# Patient Record
Sex: Female | Born: 1975 | Race: White | Hispanic: No | Marital: Married | Smoking: Former smoker
Health system: Southern US, Community
[De-identification: ages and names within clinical notes are randomized; demographics above are authoritative.]

## PROBLEM LIST (undated history)

## (undated) ENCOUNTER — Inpatient Hospital Stay (HOSPITAL_COMMUNITY): Payer: Self-pay

## (undated) DIAGNOSIS — A048 Other specified bacterial intestinal infections: Secondary | ICD-10-CM

## (undated) DIAGNOSIS — K5909 Other constipation: Secondary | ICD-10-CM

## (undated) DIAGNOSIS — D649 Anemia, unspecified: Secondary | ICD-10-CM

## (undated) DIAGNOSIS — J45909 Unspecified asthma, uncomplicated: Secondary | ICD-10-CM

## (undated) DIAGNOSIS — Z8619 Personal history of other infectious and parasitic diseases: Secondary | ICD-10-CM

## (undated) DIAGNOSIS — J189 Pneumonia, unspecified organism: Secondary | ICD-10-CM

## (undated) DIAGNOSIS — K589 Irritable bowel syndrome without diarrhea: Secondary | ICD-10-CM

## (undated) DIAGNOSIS — K649 Unspecified hemorrhoids: Secondary | ICD-10-CM

## (undated) DIAGNOSIS — O09529 Supervision of elderly multigravida, unspecified trimester: Secondary | ICD-10-CM

## (undated) HISTORY — DX: Personal history of other infectious and parasitic diseases: Z86.19

## (undated) HISTORY — PX: INTRAUTERINE DEVICE (IUD) INSERTION: SHX5877

## (undated) HISTORY — DX: Pneumonia, unspecified organism: J18.9

## (undated) HISTORY — DX: Supervision of elderly multigravida, unspecified trimester: O09.529

## (undated) HISTORY — DX: Other constipation: K59.09

## (undated) HISTORY — DX: Unspecified hemorrhoids: K64.9

## (undated) HISTORY — DX: Anemia, unspecified: D64.9

## (undated) HISTORY — DX: Other specified bacterial intestinal infections: A04.8

## (undated) HISTORY — DX: Unspecified asthma, uncomplicated: J45.909

## (undated) HISTORY — PX: WISDOM TOOTH EXTRACTION: SHX21

## (undated) HISTORY — PX: BREAST BIOPSY: SHX20

## (undated) HISTORY — DX: Irritable bowel syndrome, unspecified: K58.9

---

## 2011-12-16 ENCOUNTER — Other Ambulatory Visit: Payer: Self-pay | Admitting: Obstetrics and Gynecology

## 2011-12-16 DIAGNOSIS — R928 Other abnormal and inconclusive findings on diagnostic imaging of breast: Secondary | ICD-10-CM

## 2011-12-18 ENCOUNTER — Ambulatory Visit
Admission: RE | Admit: 2011-12-18 | Discharge: 2011-12-18 | Disposition: A | Discharge status: Home / Self Care | Payer: BC Managed Care – PPO | Source: Ambulatory Visit | Attending: Obstetrics and Gynecology | Admitting: Obstetrics and Gynecology

## 2011-12-18 DIAGNOSIS — R928 Other abnormal and inconclusive findings on diagnostic imaging of breast: Secondary | ICD-10-CM

## 2011-12-26 ENCOUNTER — Ambulatory Visit
Admission: RE | Admit: 2011-12-26 | Discharge: 2011-12-26 | Disposition: A | Discharge status: Home / Self Care | Payer: BC Managed Care – PPO | Source: Ambulatory Visit | Attending: Obstetrics and Gynecology | Admitting: Obstetrics and Gynecology

## 2011-12-26 ENCOUNTER — Ambulatory Visit: Admission: RE | Admit: 2011-12-26 | Payer: BC Managed Care – PPO | Source: Ambulatory Visit

## 2011-12-26 ENCOUNTER — Other Ambulatory Visit: Payer: Self-pay | Admitting: Obstetrics and Gynecology

## 2011-12-26 DIAGNOSIS — R928 Other abnormal and inconclusive findings on diagnostic imaging of breast: Secondary | ICD-10-CM

## 2012-02-27 LAB — OB RESULTS CONSOLE RUBELLA ANTIBODY, IGM: Rubella: IMMUNE

## 2012-02-27 LAB — OB RESULTS CONSOLE GC/CHLAMYDIA
Chlamydia: NEGATIVE
Gonorrhea: NEGATIVE

## 2012-02-27 LAB — OB RESULTS CONSOLE ANTIBODY SCREEN: Antibody Screen: NEGATIVE

## 2012-06-08 ENCOUNTER — Inpatient Hospital Stay (HOSPITAL_COMMUNITY)
Admission: AD | Admit: 2012-06-08 | Discharge: 2012-06-09 | Disposition: A | Discharge status: Home / Self Care | Payer: BC Managed Care – PPO | Source: Ambulatory Visit | Attending: Obstetrics and Gynecology | Admitting: Obstetrics and Gynecology

## 2012-06-08 ENCOUNTER — Encounter (HOSPITAL_COMMUNITY): Payer: Self-pay | Admitting: *Deleted

## 2012-06-08 DIAGNOSIS — K645 Perianal venous thrombosis: Secondary | ICD-10-CM

## 2012-06-08 MED ORDER — LIDOCAINE HCL (PF) 1 % IJ SOLN: 10.0000 mL | Freq: Once | INTRAMUSCULAR | Status: DC

## 2012-06-08 MED ORDER — OXYCODONE-ACETAMINOPHEN 5-325 MG PO TABS: 1.0000 | ORAL_TABLET | ORAL | Status: DC | PRN

## 2012-06-08 MED ORDER — OXYCODONE-ACETAMINOPHEN 5-325 MG PO TABS
2.0000 | ORAL_TABLET | Freq: Once | ORAL | Status: AC
  Administered 2012-06-08: 2 via ORAL
  Filled 2012-06-08: qty 2

## 2012-06-08 MED ORDER — LIDOCAINE HCL (PF) 1 % IJ SOLN
INTRAMUSCULAR | Status: AC
  Filled 2012-06-08: qty 30

## 2012-06-08 NOTE — Progress Notes (Signed)
24 4/7 weeks S/P I&D of hemorrhoid today in office Pain now worse BM x 1 after office today, hx of constipation  VSS Afeb FHT + Two hemorrhoids noted at about 12:00 and 5:00 Bottom one S/P I&D has re accumulated clot, top one possibly with clot D/W patient and husband Procedure: betadine prep, injected with 1 % plain lidocaine                     I&D of bottom hemorrhoid-clot expelled                     I&D of top one-no clot obtained                     Patient tolerated well  A: thrombosed hemorrhoid-S/P I&D P: D/W patient water intake, Mirilax stool softener, fruit, etc     Peri bottle given, rest on side prn

## 2012-06-08 NOTE — MAU Provider Note (Signed)
Chief Complaint:  Hemorrhoids   First Provider Initiated Contact with Patient 06/08/12 2300    HPI: Alyssa Martinez is a 37 y.o. G2P1001 at [redacted]w[redacted]d who presents to maternity admissions reporting increased pain since lancing of hemorrhoid at office today. Denies fever, chills. Scant bleeding from procedure. No purulent drainage. Had BM this afternoon. No improvement in pain w/ Tylenol. Denies contractions, leakage of fluid or vaginal bleeding. Good fetal movement.   Past Medical History: No past medical history on file.  Past obstetric history: OB History   Grav Para Term Preterm Abortions TAB SAB Ect Mult Living   2 1 1       1      # Outc Date GA Lbr Len/2nd Wgt Sex Del Anes PTL Lv   1 TRM 2011    M SVD EPI  Yes   Comments: fever in labor, Newborn Abx   2 CUR               Past Surgical History: No past surgical history on file.  Family History: No family history on file.  Social History: History  Substance Use Topics  . Smoking status: Never Smoker   . Smokeless tobacco: Not on file  . Alcohol Use: No    Allergies: No Known Allergies  Meds:  Prescriptions prior to admission  Medication Sig Dispense Refill  . acetaminophen (TYLENOL) 500 MG tablet Take 500 mg by mouth every 6 (six) hours as needed for pain.      Marland Kitchen albuterol (PROVENTIL HFA;VENTOLIN HFA) 108 (90 BASE) MCG/ACT inhaler Inhale 2 puffs into the lungs every 6 (six) hours as needed for wheezing.      . docusate sodium (COLACE) 50 MG capsule Take by mouth 2 (two) times daily.      Marland Kitchen loratadine (CLARITIN) 10 MG tablet Take 10 mg by mouth daily.      . Prenatal Vit-Fe Fumarate-FA (PRENATAL MULTIVITAMIN) TABS Take 1 tablet by mouth daily at 12 noon.        ROS: Pertinent findings in history of present illness.  Physical Exam  Blood pressure 117/67, pulse 72, temperature 98 F (36.7 C), temperature source Oral, resp. rate 18, height 5\' 8"  (1.727 m), weight 162 lb (73.483 kg), last menstrual period  12/19/2011. GENERAL: Well-developed, well-nourished female in moderate distress. Unable to sit. HEENT: normocephalic HEART: normal rate RESP: normal effort ABDOMEN: Soft, non-tender, gravid appropriate for gestational age EXTREMITIES: Nontender, no edema NEURO: alert and oriented SPECULUM EXAM: Deferred RECTAL: 2 hemorrhoids.  #1: posterior, 2x3 cm, thrombosed, very tender, firm, closed incision. No bleeding or drainage.   #2: anterior 1x2 cm, pink, moderately tender, firm.     FHR 156 by doppler   Labs: NA  Procedure:  See Dr. Huel Coventry note. Verbal consent obtained.   MAU Course: Pt's pain significantly improved w/ removal of thrombus and Percocet.   Assessment: 1. Thrombosed external hemorrhoid    Plan: Discharge home Perterm labor precautions and fetal kick counts. Ice to hemorrhoids x 24 hours.  Increase fluids. Continue stool softener.  Follow-up Information   Follow up with [facility],JAMES E, MD. (as scheduled or as needed if symptoms worsen)    Contact information:   [address] [place] [address] [phone]       Follow up with [facility] [place] [facility] ADMISS[facility]. (As needed if symptoms worsen)    Contact information:   [address] [place] [address] [phone]  Medication List    TAKE these medications       acetaminophen 500 MG tablet  Commonly known as:  TYLENOL  Take 500 mg by mouth every 6 (six) hours as needed for pain.     albuterol 108 (90 BASE) MCG/ACT inhaler  Commonly known as:  PROVENTIL HFA;VENTOLIN HFA  Inhale 2 puffs into the lungs every 6 (six) hours as needed for wheezing.     docusate sodium 50 MG capsule  Commonly known as:  COLACE  Take by mouth 2 (two) times daily.     loratadine 10 MG tablet  Commonly known as:  CLARITIN  Take 10 mg by mouth daily.     oxyCODONE-acetaminophen 5-325 MG per tablet  Commonly known as:  ROXICET  Take 1-2 tablets by mouth  every 4 (four) hours as needed for pain.     prenatal multivitamin Tabs  Take 1 tablet by mouth daily at 12 noon.        [place], [place] 06/08/2012 10:13 PM

## 2012-06-08 NOTE — MAU Note (Addendum)
Pt reports that she was seen in office today. Dr. In office lanced her hemorroid. Pt now reports increase in pain. Pt took tylenol last @ 1800.

## 2012-06-09 ENCOUNTER — Encounter (INDEPENDENT_AMBULATORY_CARE_PROVIDER_SITE_OTHER): Payer: Self-pay | Admitting: General Surgery

## 2012-06-09 ENCOUNTER — Ambulatory Visit (INDEPENDENT_AMBULATORY_CARE_PROVIDER_SITE_OTHER): Payer: BC Managed Care – PPO | Admitting: General Surgery

## 2012-06-09 VITALS — BP 125/77 | HR 62 | Temp 98.4°F | Resp 16 | Ht 68.0 in | Wt 160.0 lb

## 2012-06-09 DIAGNOSIS — K645 Perianal venous thrombosis: Secondary | ICD-10-CM

## 2012-06-09 NOTE — Progress Notes (Signed)
Subjective:     Patient ID: Alyssa Martinez, female   DOB: 10-20-1975, 37 y.o.   MRN: [id number]  HPI 37 year old Caucasian female who is 24-4/[redacted] weeks pregnant is referred by Dr. Henderson Cloud for evaluation of recurrent thrombosed external hemorrhoids. The patient states she developed 3 hemorrhoids during the delivery of her first pregnancy however they subsequently resolved. During her second pregnancy she has had some intermittent problems with hemorrhoids. However they have been particular bothersome for the past month. She has tried numerous over-the-counter products. She has tried Preparation H and tux pads as well as Anusol suppository. They became acutely painful yesterday. She underwent incision and evacuation of a thrombosed external hemorrhoid and her OB/GYN's office yesterday morning. She simply got worse throughout the day with more pain and more swelling and she presented to the hospital last night where they were relanced. She states that the pain is worsening around her bottom. She has had some problems with constipation. She has a bowel movement every couple days. She has been taking Colace. She just started MiraLAX last night. She doesn't drink a lot of water. She does not sit on the commode for prolonged period of time. She complains of some intermittent itching around her bottom. She denies any incontinence.she denies any dysuria  PMHx, PSHx, SOCHx, FAMHx, ALL reviewed    Review of Systems 8 point review of systems is performed all systems are negative except for what is mentioned in history of present illness.    Objective:   Physical Exam  Constitutional: She is oriented to person, place, and time. She appears well-developed and well-nourished.  Looks uncomfortable  HENT:  Head: Normocephalic and atraumatic.  Right Ear: External ear normal.  Left Ear: External ear normal.  Eyes: Conjunctivae are normal.  Pulmonary/Chest: Effort normal. No stridor. No respiratory distress.    Abdominal: Soft.  Genitourinary:     Neurological: She is alert and oriented to person, place, and time.  Skin: Skin is warm and dry. She is not diaphoretic.  Psychiatric: She has a normal mood and affect. Her behavior is normal. Judgment and thought content normal.       Assessment:     Thrombosed left sided external hemorrhoids     Plan:     We discussed hemorrhoidal disease. The patient is given Agricultural engineer. Since she is still symptomatic and they're still quite swollen I recommended repeat incision and evacuation of the clot. They're too large to be completely excised today. We discussed the importance of drinking 6-8 glasses of water a day. We discussed using sitz baths. We also discussed taking stool softeners and MiraLAX. We discussed the importance of having a daily soft bulky bowel movement. We discussed the importance of incorporating fiber in her diet. She was given Agricultural engineer.  After obtaining verbal consent the area was prepped with Betadine. A total of 3 cc of 2% Xylocaine with epinephrine was infiltrated throughout the entire hemorrhoid. I then made a one-inch incision in the left posterior lateral hemorrhoid and evacuated the entire clot. I then made a separate incision in the left anterior hemorrhoid which was smaller and evacuated clot. There is minimal bleeding.  She was given wound care instructions. I spent approximately 30 minutes with this patient  Followup as needed.   Mary Sella. Andrey Campanile, MD, FACS General, Bariatric, & Minimally Invasive Surgery [facility], [place]

## 2012-06-09 NOTE — Patient Instructions (Signed)
Hemorrhoids Hemorrhoids are enlarged (dilated) veins around the rectum. There are 2 types of hemorrhoids, and the type of hemorrhoid is determined by its location. Internal hemorrhoids occur in the veins just inside the rectum.They are usually not painful, but they may bleed.However, they may poke through to the outside and become irritated and painful. External hemorrhoids involve the veins outside the anus and can be felt as a painful swelling or hard lump near the anus.They are often itchy and may crack and bleed. Sometimes clots will form in the veins. This makes them swollen and painful. These are called thrombosed hemorrhoids. CAUSES Causes of hemorrhoids include:  Pregnancy. This increases the pressure in the hemorrhoidal veins.  Constipation.  Straining to have a bowel movement.  Obesity.  Heavy lifting or other activity that caused you to strain.   HOME CARE INSTRUCTIONS   Increase fiber in your diet. Ask your caregiver about using fiber supplements.  Drink enough water and fluids to keep your urine clear or pale yellow.  Exercise regularly.  Go to the bathroom when you have the urge to have a bowel movement. Do not wait.  Avoid straining to have bowel movements.  Keep the anal area dry and clean.  Only take over-the-counter or prescription medicines for pain, discomfort, or fever as directed by your caregiver. If your hemorrhoids are thrombosed:  Take warm sitz baths for 20 to 30 minutes, 3 to 4 times per day.  If the hemorrhoids are very tender and swollen, place ice packs on the area as tolerated. Using ice packs between sitz baths may be helpful. Fill a plastic bag with ice. Place a towel between the bag of ice and your skin.  Medicated creams and suppositories may be used or applied as directed.  Do not use a donut-shaped pillow or sit on the toilet for long periods. This increases blood pooling and pain. SEEK MEDICAL CARE IF:   You have increasing pain and  swelling that is not controlled with your medicine.  You have uncontrolled bleeding.  You have difficulty or you are unable to have a bowel movement.  You have pain or inflammation outside the area of the hemorrhoids.  You have chills or an oral temperature above 102 F (38.9 C). MAKE SURE YOU:   Understand these instructions.  Will watch your condition.  Will get help right away if you are not doing well or get worse. Document Released: 02/02/2000 Document Revised: 04/29/2011 Document Reviewed: 01/15/2010 [facility] Patient Information 2013 [place], [place].   GETTING TO GOOD BOWEL HEALTH. Irregular bowel habits such as constipation and diarrhea can lead to many problems over time.  Having one soft bowel movement a day is the most important way to prevent further problems.  The anorectal canal is designed to handle stretching and feces to safely manage our ability to get rid of solid waste (feces, poop, stool) out of our body.  BUT, hard constipated stools can act like ripping concrete bricks and diarrhea can be a burning fire to this very sensitive area of our body, causing inflamed hemorrhoids, anal fissures, increasing risk is perirectal abscesses, abdominal pain/bloating, an making irritable bowel worse.     The goal: ONE SOFT BOWEL MOVEMENT A DAY!  To have soft, regular bowel movements:    Drink at least 8 tall glasses of water a day.     Take plenty of fiber.  Fiber is the undigested part of plant food that passes into the colon, acting s "natures broom" to encourage  bowel motility and movement.  Fiber can absorb and hold large amounts of water. This results in a larger, bulkier stool, which is soft and easier to pass. Work gradually over several weeks up to 6 servings a day of fiber (25g a day even more if needed) in the form of: o Vegetables -- Root (potatoes, carrots, turnips), leafy green (lettuce, salad greens, celery, spinach), or cooked high residue (cabbage, broccoli,  etc) o Fruit -- Fresh (unpeeled skin & pulp), Dried (prunes, apricots, cherries, etc ),  or stewed ( applesauce)  o Whole grain breads, pasta, etc (whole wheat)  o Bran cereals    Bulking Agents -- This type of water-retaining fiber generally is easily obtained each day by one of the following:  o Psyllium bran -- The psyllium plant is remarkable because its ground seeds can retain so much water. This product is available as Metamucil, Konsyl, Effersyllium, Benefiber o Per Diem Fiber, or the less expensive generic preparation in drug and health food stores. Although labeled a laxative, it really is not a laxative.  o Methylcellulose -- This is another fiber derived from wood which also retains water. It is available as Citrucel. o Polyethylene Glycol - and "artificial" fiber commonly called Miralax or Glycolax.  It is helpful for people with gassy or bloated feelings with regular fiber o Flax Seed - a less gassy fiber than psyllium   No reading or other relaxing activity while on the toilet. If bowel movements take longer than 5 minutes, you are too constipated   AVOID CONSTIPATION.  High fiber and water intake usually takes care of this.  Sometimes a laxative is needed to stimulate more frequent bowel movements, but    Laxatives are not a good long-term solution as it can wear the colon out. o Osmotics (Milk of Magnesia, Fleets phosphosoda, Magnesium citrate, MiraLax, GoLytely) are safer than  o Stimulants (Senokot, Castor Oil, Dulcolax, Ex Lax)    o Do not take laxatives for more than 7days in a row.    IF SEVERELY CONSTIPATED, try a Bowel Retraining Program: o Do not use laxatives.  o Eat a diet high in roughage, such as bran cereals and leafy vegetables.  o Drink six (6) ounces of prune or apricot juice each morning.  o Eat two (2) large servings of stewed fruit each day.  o Take one (1) heaping tablespoon of a psyllium-based bulking agent twice a day. Use sugar-free sweetener when possible  to avoid excessive calories.  o Eat a normal breakfast.  o Set aside 15 minutes after breakfast to sit on the toilet, but do not strain to have a bowel movement.  o If you do not have a bowel movement by the third day, use an enema and repeat the above steps.  o

## 2012-06-11 ENCOUNTER — Encounter (INDEPENDENT_AMBULATORY_CARE_PROVIDER_SITE_OTHER): Payer: Self-pay | Admitting: Surgery

## 2012-06-11 ENCOUNTER — Ambulatory Visit (INDEPENDENT_AMBULATORY_CARE_PROVIDER_SITE_OTHER): Payer: BC Managed Care – PPO | Admitting: Surgery

## 2012-06-11 VITALS — BP 118/70 | HR 80 | Resp 18 | Ht 68.0 in | Wt 156.0 lb

## 2012-06-11 DIAGNOSIS — K645 Perianal venous thrombosis: Secondary | ICD-10-CM

## 2012-06-11 MED ORDER — LIDOCAINE HCL 2 % EX GEL: CUTANEOUS | Status: AC

## 2012-06-11 NOTE — Progress Notes (Signed)
Urgent office on 06/11/12 This patient is a followup from 2 days ago after Dr. Andrey Campanile evacuated a left thrombosed external hemorrhoid and incised the left anterior hemorrhoid. The patient returns with complaints of continuing pain and some blood with bowel movements. She states that the pain is slightly improved from before her last visit. She still feels some swelling and wants to have this rechecked before the weekend.  She is not really using any pain medication. She is doing occasional sitz baths.  On examination I can see the 2 incisions. These remain open. There is no reaccumulation of the thrombosed hemorrhoids. As would be expected, her hemorrhoids are still mildly swollen. However there does not seem to be any complication from the recent procedure. It is just still too early for her to have complete relief.  I assured her that this would continue to improve. Recommend more frequent sitz baths both before and after bowel movements. I gave her a prescription for lidocaine ointment. Followup in 2 weeks.  Wilmon Arms. Corliss Skains, MD, [facility]  06/11/2012 3:56 PM

## 2012-07-03 ENCOUNTER — Ambulatory Visit (INDEPENDENT_AMBULATORY_CARE_PROVIDER_SITE_OTHER): Payer: BC Managed Care – PPO | Admitting: Surgery

## 2012-07-03 ENCOUNTER — Encounter (INDEPENDENT_AMBULATORY_CARE_PROVIDER_SITE_OTHER): Payer: Self-pay | Admitting: Surgery

## 2012-07-03 VITALS — BP 102/60 | HR 72 | Temp 97.0°F | Resp 16 | Ht 68.0 in | Wt 163.4 lb

## 2012-07-03 DIAGNOSIS — K645 Perianal venous thrombosis: Secondary | ICD-10-CM

## 2012-07-03 NOTE — Patient Instructions (Addendum)
Daily fiber supplements Use Miralax or stool softeners as needed Follow-up as needed - [phone] Dr. Corliss Skains

## 2012-07-03 NOTE — Progress Notes (Signed)
Followup of her thrombosed external hemorrhoids. She does feel much better. The hemorrhoids have healed and are much smaller. On the left and posteriorly there are 2 very small external hemorrhoids. There still some firm clot within these but these did not appear to be inflamed. No sign of any fissure. No inflammation noted. Ex  We discussed healthy bowel regimen with a daily fiber supplement and additional stool softener/MiraLAX as needed. After she has delivered her baby, if she continues to have significant problems with external hemorrhoids or external skin tags that I encouraged her to call is for followup visit. Currently there is no further intervention needed.  Wilmon Arms. Corliss Skains, MD, [facility]  General/ Trauma Surgery  07/03/2012 9:24 AM

## 2012-08-25 LAB — OB RESULTS CONSOLE GBS: GBS: NEGATIVE

## 2012-09-17 ENCOUNTER — Encounter (HOSPITAL_COMMUNITY): Payer: Self-pay | Admitting: *Deleted

## 2012-09-17 ENCOUNTER — Telehealth (HOSPITAL_COMMUNITY): Payer: Self-pay | Admitting: *Deleted

## 2012-09-17 NOTE — Telephone Encounter (Signed)
Preadmission screen  

## 2012-09-24 ENCOUNTER — Inpatient Hospital Stay (HOSPITAL_COMMUNITY)
Admission: RE | Admit: 2012-09-24 | Discharge: 2012-09-27 | DRG: 373 | Disposition: A | Discharge status: Home / Self Care | Payer: BC Managed Care – PPO | Source: Ambulatory Visit | Attending: Obstetrics and Gynecology | Admitting: Obstetrics and Gynecology

## 2012-09-24 ENCOUNTER — Encounter (HOSPITAL_COMMUNITY): Payer: Self-pay

## 2012-09-24 DIAGNOSIS — O878 Other venous complications in the puerperium: Secondary | ICD-10-CM | POA: Diagnosis present

## 2012-09-24 DIAGNOSIS — K649 Unspecified hemorrhoids: Secondary | ICD-10-CM | POA: Diagnosis present

## 2012-09-24 DIAGNOSIS — O09529 Supervision of elderly multigravida, unspecified trimester: Secondary | ICD-10-CM | POA: Diagnosis present

## 2012-09-24 DIAGNOSIS — O480 Post-term pregnancy: Principal | ICD-10-CM | POA: Diagnosis present

## 2012-09-24 LAB — CBC
HCT: 30.9 % — ABNORMAL LOW (ref 36.0–46.0)
Hemoglobin: 10.9 g/dL — ABNORMAL LOW (ref 12.0–15.0)
MCH: 32.1 pg (ref 26.0–34.0)
MCHC: 35.3 g/dL (ref 30.0–36.0)
RBC: 3.4 MIL/uL — ABNORMAL LOW (ref 3.87–5.11)

## 2012-09-24 MED ORDER — OXYTOCIN 40 UNITS IN LACTATED RINGERS INFUSION - SIMPLE MED
62.5000 mL/h | INTRAVENOUS | Status: DC
  Administered 2012-09-26: 62.5 mL/h via INTRAVENOUS

## 2012-09-24 MED ORDER — BUTORPHANOL TARTRATE 1 MG/ML IJ SOLN
1.0000 mg | INTRAMUSCULAR | Status: DC | PRN
  Administered 2012-09-25: 1 mg via INTRAVENOUS
  Filled 2012-09-24: qty 1

## 2012-09-24 MED ORDER — MISOPROSTOL 25 MCG QUARTER TABLET
25.0000 ug | ORAL_TABLET | ORAL | Status: DC | PRN
  Administered 2012-09-25 (×2): 25 ug via VAGINAL
  Filled 2012-09-24 (×2): qty 0.25
  Filled 2012-09-24: qty 1

## 2012-09-24 MED ORDER — OXYCODONE-ACETAMINOPHEN 5-325 MG PO TABS: 1.0000 | ORAL_TABLET | ORAL | Status: DC | PRN

## 2012-09-24 MED ORDER — ALBUTEROL SULFATE HFA 108 (90 BASE) MCG/ACT IN AERS: 2.0000 | INHALATION_SPRAY | Freq: Four times a day (QID) | RESPIRATORY_TRACT | Status: DC | PRN

## 2012-09-24 MED ORDER — LACTATED RINGERS IV SOLN: 500.0000 mL | INTRAVENOUS | Status: DC | PRN

## 2012-09-24 MED ORDER — ONDANSETRON HCL 4 MG/2ML IJ SOLN
4.0000 mg | Freq: Four times a day (QID) | INTRAMUSCULAR | Status: DC | PRN
  Administered 2012-09-25: 4 mg via INTRAVENOUS
  Filled 2012-09-24: qty 2

## 2012-09-24 MED ORDER — LACTATED RINGERS IV SOLN
INTRAVENOUS | Status: DC
  Administered 2012-09-24 – 2012-09-25 (×3): via INTRAVENOUS
  Administered 2012-09-25: 1000 mL via INTRAVENOUS

## 2012-09-24 MED ORDER — OXYTOCIN BOLUS FROM INFUSION: 500.0000 mL | INTRAVENOUS | Status: DC

## 2012-09-24 MED ORDER — TERBUTALINE SULFATE 1 MG/ML IJ SOLN: 0.2500 mg | Freq: Once | INTRAMUSCULAR | Status: AC | PRN

## 2012-09-24 MED ORDER — FLEET ENEMA 7-19 GM/118ML RE ENEM: 1.0000 | ENEMA | RECTAL | Status: DC | PRN

## 2012-09-24 MED ORDER — ZOLPIDEM TARTRATE 5 MG PO TABS
5.0000 mg | ORAL_TABLET | Freq: Every evening | ORAL | Status: DC | PRN
  Administered 2012-09-25: 5 mg via ORAL
  Filled 2012-09-24: qty 1

## 2012-09-24 MED ORDER — LIDOCAINE HCL (PF) 1 % IJ SOLN
30.0000 mL | INTRAMUSCULAR | Status: DC | PRN
  Filled 2012-09-24: qty 30

## 2012-09-24 MED ORDER — ACETAMINOPHEN 325 MG PO TABS: 650.0000 mg | ORAL_TABLET | ORAL | Status: DC | PRN

## 2012-09-24 MED ORDER — CITRIC ACID-SODIUM CITRATE 334-500 MG/5ML PO SOLN
30.0000 mL | ORAL | Status: DC | PRN
  Filled 2012-09-24: qty 15

## 2012-09-24 MED ORDER — IBUPROFEN 600 MG PO TABS: 600.0000 mg | ORAL_TABLET | Freq: Four times a day (QID) | ORAL | Status: DC | PRN

## 2012-09-25 ENCOUNTER — Inpatient Hospital Stay (HOSPITAL_COMMUNITY): Payer: BC Managed Care – PPO | Admitting: Anesthesiology

## 2012-09-25 ENCOUNTER — Encounter (HOSPITAL_COMMUNITY): Payer: Self-pay

## 2012-09-25 ENCOUNTER — Encounter (HOSPITAL_COMMUNITY): Payer: Self-pay | Admitting: Anesthesiology

## 2012-09-25 LAB — CBC
HCT: 33.8 % — ABNORMAL LOW (ref 36.0–46.0)
MCHC: 34.6 g/dL (ref 30.0–36.0)
Platelets: 125 10*3/uL — ABNORMAL LOW (ref 150–400)
RDW: 13.3 % (ref 11.5–15.5)
WBC: 10.3 10*3/uL (ref 4.0–10.5)

## 2012-09-25 MED ORDER — DIPHENHYDRAMINE HCL 50 MG/ML IJ SOLN
12.5000 mg | INTRAMUSCULAR | Status: DC | PRN
Start: 2012-09-25 — End: 2012-09-26

## 2012-09-25 MED ORDER — FENTANYL 2.5 MCG/ML BUPIVACAINE 1/10 % EPIDURAL INFUSION (WH - ANES)
14.0000 mL/h | INTRAMUSCULAR | Status: DC | PRN
  Administered 2012-09-25: 14 mL/h via EPIDURAL
  Filled 2012-09-25: qty 125

## 2012-09-25 MED ORDER — EPHEDRINE 5 MG/ML INJ
10.0000 mg | INTRAVENOUS | Status: DC | PRN
  Filled 2012-09-25: qty 2

## 2012-09-25 MED ORDER — LIDOCAINE HCL (PF) 1 % IJ SOLN
INTRAMUSCULAR | Status: DC | PRN
  Administered 2012-09-25 (×4): 4 mL

## 2012-09-25 MED ORDER — OXYTOCIN 40 UNITS IN LACTATED RINGERS INFUSION - SIMPLE MED
1.0000 m[IU]/min | INTRAVENOUS | Status: DC
  Administered 2012-09-25: 6 m[IU]/min via INTRAVENOUS
  Administered 2012-09-25: 4 m[IU]/min via INTRAVENOUS
  Administered 2012-09-25: 2 m[IU]/min via INTRAVENOUS
  Filled 2012-09-25: qty 1000

## 2012-09-25 MED ORDER — TERBUTALINE SULFATE 1 MG/ML IJ SOLN: 0.2500 mg | Freq: Once | INTRAMUSCULAR | Status: AC | PRN

## 2012-09-25 MED ORDER — LACTATED RINGERS IV SOLN
500.0000 mL | Freq: Once | INTRAVENOUS | Status: AC
  Administered 2012-09-25: 500 mL via INTRAVENOUS

## 2012-09-25 MED ORDER — EPHEDRINE 5 MG/ML INJ
10.0000 mg | INTRAVENOUS | Status: DC | PRN
  Filled 2012-09-25: qty 2
  Filled 2012-09-25: qty 4

## 2012-09-25 MED ORDER — PHENYLEPHRINE 40 MCG/ML (10ML) SYRINGE FOR IV PUSH (FOR BLOOD PRESSURE SUPPORT)
80.0000 ug | PREFILLED_SYRINGE | INTRAVENOUS | Status: DC | PRN
  Filled 2012-09-25: qty 5
  Filled 2012-09-25: qty 2

## 2012-09-25 MED ORDER — PHENYLEPHRINE 40 MCG/ML (10ML) SYRINGE FOR IV PUSH (FOR BLOOD PRESSURE SUPPORT)
80.0000 ug | PREFILLED_SYRINGE | INTRAVENOUS | Status: DC | PRN
  Filled 2012-09-25: qty 2

## 2012-09-25 NOTE — Progress Notes (Signed)
Pt feeling mild discomfort w/ ctx  FHT reassuring w/ accels Toco Q2-3 Cvx 1.5/50/-2 Pt too uncomfortable w/ exam to AROM  A/P:  Continue pitocin

## 2012-09-25 NOTE — Anesthesia Preprocedure Evaluation (Signed)
Anesthesia Evaluation  Patient identified by MRN, date of birth, ID band Patient awake    Reviewed: Allergy & Precautions, H&P , NPO status , Patient's Chart, lab work & pertinent test results, reviewed documented beta blocker date and time   History of Anesthesia Complications Negative for: history of anesthetic complications  Airway Mallampati: II TM Distance: >3 FB Neck ROM: full    Dental  (+) Teeth Intact   Pulmonary asthma (allergy-induced, last inhaler use several months ago) ,  breath sounds clear to auscultation        Cardiovascular negative cardio ROS  Rhythm:regular Rate:Normal     Neuro/Psych negative neurological ROS  negative psych ROS   GI/Hepatic negative GI ROS, Neg liver ROS,   Endo/Other  negative endocrine ROS  Renal/GU negative Renal ROS     Musculoskeletal   Abdominal   Peds  Hematology Thrombocytopenia - plt 125   Anesthesia Other Findings   Reproductive/Obstetrics (+) Pregnancy                           Anesthesia Physical Anesthesia Plan  ASA: II  Anesthesia Plan: Epidural   Post-op Pain Management:    Induction:   Airway Management Planned:   Additional Equipment:   Intra-op Plan:   Post-operative Plan:   Informed Consent: I have reviewed the patients History and Physical, chart, labs and discussed the procedure including the risks, benefits and alternatives for the proposed anesthesia with the patient or authorized representative who has indicated his/her understanding and acceptance.     Plan Discussed with:   Anesthesia Plan Comments:         Anesthesia Quick Evaluation

## 2012-09-25 NOTE — Anesthesia Procedure Notes (Signed)
Epidural Patient location during procedure: OB Start time: 09/25/2012 5:48 PM  Staffing Performed by: anesthesiologist   Preanesthetic Checklist Completed: patient identified, site marked, surgical consent, pre-op evaluation, timeout performed, IV checked, risks and benefits discussed and monitors and equipment checked  Epidural Patient position: sitting Prep: site prepped and draped and DuraPrep Patient monitoring: continuous pulse ox and blood pressure Approach: midline Injection technique: LOR air  Needle:  Needle type: Tuohy  Needle gauge: 17 G Needle length: 9 cm and 9 Needle insertion depth: 6 cm Catheter type: closed end flexible Catheter size: 19 Gauge Catheter at skin depth: 11 cm Test dose: negative  Assessment Events: blood not aspirated, injection not painful, no injection resistance, negative IV test and no paresthesia  Additional Notes Discussed risk of headache, infection, bleeding, nerve injury and failed or incomplete block.  Patient voices understanding and wishes to proceed.  Epidural placed easily on first attempt.  No paresthesia.  Patient tolerated procedure well with no apparent complications.  ARodman Pickle MDReason for block:procedure for pain

## 2012-09-25 NOTE — Progress Notes (Signed)
Pt getting more uncomfortable w/ ctx.  No vb or lof.  + FM  FHT reassuring Toco Q2-5 Cvx unchanged per RN exam  A/P:  Epidural Exp mngt arom when able

## 2012-09-25 NOTE — H&P (Signed)
Alyssa Martinez is a 37 y.o. female presenting for postdates IOL. History OB History   Grav Para Term Preterm Abortions TAB SAB Ect Mult Living   2 1 1       1      Past Medical History  Diagnosis Date  . Anemia   . Hx of varicella   . Asthma   . AMA (advanced maternal age) multigravida 35+   . Hemorrhoid    Past Surgical History  Procedure Laterality Date  . Wisdom tooth extraction     Family History: family history includes Cancer in her maternal grandmother, mother, and paternal grandmother; Hypertension in her mother; Thyroid disease in her mother; and Urolithiasis in her brother and father. Social History:  reports that she has never smoked. She has never used smokeless tobacco. She reports that she does not drink alcohol or use illicit drugs.   Prenatal Transfer Tool  Maternal Diabetes: No Genetic Screening: Normal Maternal Ultrasounds/Referrals: Normal Fetal Ultrasounds or other Referrals:  None Maternal Substance Abuse:  No Significant Maternal Medications:  None Significant Maternal Lab Results:  None Other Comments:  None  ROS  Dilation: Fingertip Effacement (%): Thick Station: -2 Exam by:: Dr Renaldo Fiddler Blood pressure 132/71, pulse 66, temperature 98.3 F (36.8 C), temperature source Oral, resp. rate 18, height 5\' 8"  (1.727 m), weight 79.833 kg (176 lb), last menstrual period 12/19/2011. Exam Physical Exam  Prenatal labs: ABO, Rh: O/Positive/-- (01/09 0000) Antibody: Negative (01/09 0000) Rubella: Immune (01/09 0000) RPR: NON REACTIVE (08/07 2010)  HBsAg: Negative (01/09 0000)  HIV: Non-reactive (01/09 0000)  GBS: Negative (07/08 0000)   Assessment/Plan: Admit  Induction Risks/benefits discussed Epidural prn   Jeffrey Voth 09/25/2012, 9:39 AM

## 2012-09-26 ENCOUNTER — Encounter (HOSPITAL_COMMUNITY): Payer: Self-pay

## 2012-09-26 LAB — CBC
HCT: 31.1 % — ABNORMAL LOW (ref 36.0–46.0)
MCHC: 35.4 g/dL (ref 30.0–36.0)
RDW: 13.3 % (ref 11.5–15.5)
WBC: 13.1 10*3/uL — ABNORMAL HIGH (ref 4.0–10.5)

## 2012-09-26 MED ORDER — WITCH HAZEL-GLYCERIN EX PADS
1.0000 "application " | MEDICATED_PAD | CUTANEOUS | Status: DC | PRN
  Administered 2012-09-26: 1 via TOPICAL

## 2012-09-26 MED ORDER — SIMETHICONE 80 MG PO CHEW: 80.0000 mg | CHEWABLE_TABLET | ORAL | Status: DC | PRN

## 2012-09-26 MED ORDER — BENZOCAINE-MENTHOL 20-0.5 % EX AERO
1.0000 "application " | INHALATION_SPRAY | CUTANEOUS | Status: DC | PRN
  Administered 2012-09-26: 1 via TOPICAL
  Filled 2012-09-26: qty 56

## 2012-09-26 MED ORDER — ONDANSETRON HCL 4 MG/2ML IJ SOLN: 4.0000 mg | INTRAMUSCULAR | Status: DC | PRN

## 2012-09-26 MED ORDER — SENNOSIDES-DOCUSATE SODIUM 8.6-50 MG PO TABS
2.0000 | ORAL_TABLET | Freq: Every day | ORAL | Status: DC
  Administered 2012-09-26: 2 via ORAL

## 2012-09-26 MED ORDER — ONDANSETRON HCL 4 MG PO TABS: 4.0000 mg | ORAL_TABLET | ORAL | Status: DC | PRN

## 2012-09-26 MED ORDER — MEASLES, MUMPS & RUBELLA VAC ~~LOC~~ INJ
0.5000 mL | INJECTION | Freq: Once | SUBCUTANEOUS | Status: DC
  Filled 2012-09-26: qty 0.5

## 2012-09-26 MED ORDER — PRENATAL MULTIVITAMIN CH
1.0000 | ORAL_TABLET | Freq: Every day | ORAL | Status: DC
  Administered 2012-09-26 – 2012-09-27 (×2): 1 via ORAL
  Filled 2012-09-26 (×2): qty 1

## 2012-09-26 MED ORDER — MEDROXYPROGESTERONE ACETATE 150 MG/ML IM SUSP: 150.0000 mg | INTRAMUSCULAR | Status: DC | PRN

## 2012-09-26 MED ORDER — LANOLIN HYDROUS EX OINT: TOPICAL_OINTMENT | CUTANEOUS | Status: DC | PRN

## 2012-09-26 MED ORDER — TETANUS-DIPHTH-ACELL PERTUSSIS 5-2.5-18.5 LF-MCG/0.5 IM SUSP: 0.5000 mL | Freq: Once | INTRAMUSCULAR | Status: DC

## 2012-09-26 MED ORDER — DIBUCAINE 1 % RE OINT
1.0000 "application " | TOPICAL_OINTMENT | RECTAL | Status: DC | PRN
  Administered 2012-09-26: 1 via RECTAL
  Filled 2012-09-26: qty 28

## 2012-09-26 MED ORDER — DIPHENHYDRAMINE HCL 25 MG PO CAPS: 25.0000 mg | ORAL_CAPSULE | Freq: Four times a day (QID) | ORAL | Status: DC | PRN

## 2012-09-26 MED ORDER — OXYCODONE-ACETAMINOPHEN 5-325 MG PO TABS: 1.0000 | ORAL_TABLET | ORAL | Status: DC | PRN

## 2012-09-26 MED ORDER — IBUPROFEN 600 MG PO TABS
600.0000 mg | ORAL_TABLET | Freq: Four times a day (QID) | ORAL | Status: DC
  Administered 2012-09-26 – 2012-09-27 (×6): 600 mg via ORAL
  Filled 2012-09-26 (×6): qty 1

## 2012-09-26 NOTE — Progress Notes (Signed)
Post Partum Day 1 Subjective: no complaints, up ad lib and tolerating PO  Objective: Blood pressure 112/64, pulse 72, temperature 98.4 F (36.9 C), temperature source Oral, resp. rate 18, height 5\' 8"  (1.727 m), weight 79.833 kg (176 lb), last menstrual period 12/19/2011, SpO2 93.00%, unknown if currently breastfeeding.  Physical Exam:  General: alert and cooperative Lochia: appropriate Uterine Fundus: firm Incision: n/a DVT Evaluation: No evidence of DVT seen on physical exam.   Recent Labs  09/25/12 1715 09/26/12 0628  HGB 11.7* 11.0*  HCT 33.8* 31.1*    Assessment/Plan: Plan for discharge tomorrow   LOS: 2 days   Alyssa Martinez 09/26/2012, 7:49 AM

## 2012-09-26 NOTE — Anesthesia Postprocedure Evaluation (Signed)
  Anesthesia Post-op Note  Patient: Alyssa Martinez  Procedure(s) Performed: * No procedures listed *  Patient Location: PACU and Mother/Baby  Anesthesia Type:Epidural  Level of Consciousness: awake, alert  and oriented  Airway and Oxygen Therapy: Patient Spontanous Breathing  Post-op Pain: moderate Patient having pain in back area where epidural placed. No edema or discoloration on exam. Explained pain may not be caused by epidural alone. Spoke to RN who is going to give her percocet and a heating pad. Told patient to let anesthesia know if back discomfort does not go away.  Post-op Assessment: Patient's Cardiovascular Status Stable, Respiratory Function Stable, No signs of Nausea or vomiting, Adequate PO intake, No headache, No residual numbness and No residual motor weakness  Post-op Vital Signs: stable  Complications: No apparent anesthesia complications

## 2012-09-26 NOTE — Progress Notes (Signed)
SVD of vigerous female infant w/ apgars of 9,9.  Placenta delivered spontaneous w/ 3VC.   2nd degree MLE repaired w/ 3-0 vicryl rapide.  Fundus firm.  EBL 350cc .

## 2012-09-27 MED ORDER — PNEUMOCOCCAL VAC POLYVALENT 25 MCG/0.5ML IJ INJ
0.5000 mL | INJECTION | Freq: Once | INTRAMUSCULAR | Status: AC
  Administered 2012-09-27: 0.5 mL via INTRAMUSCULAR
  Filled 2012-09-27: qty 0.5

## 2012-09-27 MED ORDER — IBUPROFEN 600 MG PO TABS: 600.0000 mg | ORAL_TABLET | Freq: Four times a day (QID) | ORAL | Status: DC

## 2012-09-30 ENCOUNTER — Encounter (HOSPITAL_COMMUNITY): Payer: Self-pay | Admitting: *Deleted

## 2012-09-30 ENCOUNTER — Inpatient Hospital Stay (HOSPITAL_COMMUNITY)
Admission: AD | Admit: 2012-09-30 | Discharge: 2012-09-30 | Disposition: A | Discharge status: Home / Self Care | Payer: BC Managed Care – PPO | Source: Ambulatory Visit | Attending: Emergency Medicine | Admitting: Emergency Medicine

## 2012-09-30 ENCOUNTER — Inpatient Hospital Stay (HOSPITAL_COMMUNITY): Payer: BC Managed Care – PPO

## 2012-09-30 DIAGNOSIS — IMO0002 Reserved for concepts with insufficient information to code with codable children: Secondary | ICD-10-CM | POA: Insufficient documentation

## 2012-09-30 DIAGNOSIS — O99893 Other specified diseases and conditions complicating puerperium: Secondary | ICD-10-CM | POA: Insufficient documentation

## 2012-09-30 DIAGNOSIS — I498 Other specified cardiac arrhythmias: Secondary | ICD-10-CM | POA: Insufficient documentation

## 2012-09-30 DIAGNOSIS — R079 Chest pain, unspecified: Secondary | ICD-10-CM | POA: Insufficient documentation

## 2012-09-30 DIAGNOSIS — R030 Elevated blood-pressure reading, without diagnosis of hypertension: Secondary | ICD-10-CM | POA: Insufficient documentation

## 2012-09-30 LAB — CBC
Hemoglobin: 11.4 g/dL — ABNORMAL LOW (ref 12.0–15.0)
MCH: 31.2 pg (ref 26.0–34.0)
MCV: 91.5 fL (ref 78.0–100.0)
Platelets: 174 10*3/uL (ref 150–400)
RBC: 3.65 MIL/uL — ABNORMAL LOW (ref 3.87–5.11)

## 2012-09-30 LAB — URINALYSIS, ROUTINE W REFLEX MICROSCOPIC
Bilirubin Urine: NEGATIVE
Glucose, UA: NEGATIVE mg/dL
Ketones, ur: NEGATIVE mg/dL
pH: 6 (ref 5.0–8.0)

## 2012-09-30 LAB — COMPREHENSIVE METABOLIC PANEL
ALT: 15 U/L (ref 0–35)
AST: 19 U/L (ref 0–37)
Albumin: 2.8 g/dL — ABNORMAL LOW (ref 3.5–5.2)
Alkaline Phosphatase: 86 U/L (ref 39–117)
BUN: 14 mg/dL (ref 6–23)
Chloride: 105 mEq/L (ref 96–112)
Potassium: 3.8 mEq/L (ref 3.5–5.1)
Sodium: 138 mEq/L (ref 135–145)
Total Bilirubin: 0.2 mg/dL — ABNORMAL LOW (ref 0.3–1.2)
Total Protein: 6 g/dL (ref 6.0–8.3)

## 2012-09-30 LAB — POCT I-STAT TROPONIN I: Troponin i, poc: 0.01 ng/mL (ref 0.00–0.08)

## 2012-09-30 MED ORDER — FUROSEMIDE 20 MG PO TABS
20.0000 mg | ORAL_TABLET | Freq: Once | ORAL | Status: AC
  Administered 2012-09-30: 20 mg via ORAL
  Filled 2012-09-30: qty 1

## 2012-09-30 NOTE — ED Notes (Signed)
Breast pump at bedside. 

## 2012-09-30 NOTE — ED Notes (Signed)
Phlebotomy at bedside.

## 2012-09-30 NOTE — ED Provider Notes (Signed)
Pt improved Denies pleuritic type CP I doubt acute PE given history/exam D/w on call cardiology - he reviewed EKG Do not feel further workup necessary (doubtful cardiomyopathy) Has already been seen and had pre-eclampsia ruled out at [facility] Stable for d/c We discussed strict return precautions  Joya Gaskins, MD 09/30/12 2057

## 2012-09-30 NOTE — ED Notes (Signed)
Sent to ED via [facility] for eval of CP. Pt is 5 days postpartum. Blood work done at [facility]. Pt brady in 40's.

## 2012-09-30 NOTE — ED Provider Notes (Signed)
CSN: [id number]     Arrival date & time 09/30/12  1233 History     First MD Initiated Contact with Patient 09/30/12 1840     Chief Complaint  Patient presents with  . Chest Pain  . Bradycardia   (Consider location/radiation/quality/duration/timing/severity/associated sxs/prior Treatment) Patient is a 37 y.o. female presenting with chest pain. The history is provided by the patient.  Chest Pain Pain location:  Substernal area Pain quality: tightness   Pain radiates to:  Does not radiate Pain radiates to the back: no   Pain severity:  Mild Onset quality:  Gradual Duration:  5 days Timing:  Constant Progression:  Unchanged Chronicity:  New Context: at rest   Context: not breathing and no trauma   Relieved by:  None tried Worsened by:  Nothing tried Ineffective treatments:  None tried Associated symptoms: lower extremity edema (bilateral)   Associated symptoms: no abdominal pain, no altered mental status, no anxiety, no back pain, no cough, no diaphoresis, no dizziness, no fatigue, no fever, no headache, no nausea, no near-syncope, no numbness, no orthopnea, no palpitations, no shortness of breath, no syncope, not vomiting and no weakness   Risk factors: no coronary artery disease, no hypertension and no prior DVT/PE     Past Medical History  Diagnosis Date  . Anemia   . Hx of varicella   . Asthma   . AMA (advanced maternal age) multigravida 35+   . Hemorrhoid    Past Surgical History  Procedure Laterality Date  . Wisdom tooth extraction     Family History  Problem Relation Age of Onset  . Hypertension Mother   . Thyroid disease Mother   . Cancer Mother     breast  . Urolithiasis Father   . Urolithiasis Brother   . Cancer Maternal Grandmother     breast  . Cancer Paternal Grandmother     breast   History  Substance Use Topics  . Smoking status: Never Smoker   . Smokeless tobacco: Never Used  . Alcohol Use: No   OB History   Grav Para Term Preterm  Abortions TAB SAB Ect Mult Living   2 2 2       2      Review of Systems  Constitutional: Negative for fever, chills, diaphoresis and fatigue.  Respiratory: Positive for chest tightness. Negative for cough, shortness of breath and wheezing.   Cardiovascular: Positive for chest pain and leg swelling (bilateral). Negative for palpitations, orthopnea, syncope and near-syncope.  Gastrointestinal: Negative for nausea, vomiting, abdominal pain and abdominal distention.  Musculoskeletal: Negative for myalgias, back pain and arthralgias.  Skin: Negative for rash.  Neurological: Negative for dizziness, syncope, weakness, light-headedness, numbness and headaches.  All other systems reviewed and are negative.    Allergies  Review of patient's allergies indicates no known allergies.  Home Medications   Current Outpatient Rx  Name  Route  Sig  Dispense  Refill  . albuterol (PROVENTIL HFA;VENTOLIN HFA) 108 (90 BASE) MCG/ACT inhaler   Inhalation   Inhale 2 puffs into the lungs every 6 (six) hours as needed for wheezing.         . docusate sodium (COLACE) 100 MG capsule   Oral   Take 200-300 mg by mouth at bedtime as needed for constipation.         Marland Kitchen ibuprofen (ADVIL,MOTRIN) 600 MG tablet   Oral   Take 1 tablet (600 mg total) by mouth every 6 (six) hours.   30 tablet  0   . lidocaine (XYLOCAINE JELLY) 2 % jelly      Apply to affected area daily prn   30 mL   1   . loratadine (CLARITIN) 10 MG tablet   Oral   Take 10 mg by mouth daily as needed for allergies.          . Prenatal Vit-Fe Fumarate-FA (PRENATAL MULTIVITAMIN) TABS   Oral   Take 1 tablet by mouth daily at 12 noon.         . psyllium (METAMUCIL) 58.6 % packet   Oral   Take 1 packet by mouth daily as needed (constipation).          BP 124/70  Pulse 49  Temp(Src) 98.1 F (36.7 C) (Oral)  Resp 16  SpO2 99%  LMP 12/19/2011 Physical Exam  Nursing note and vitals reviewed. Constitutional: She is oriented  to person, place, and time. She appears well-developed and well-nourished. No distress.  HENT:  Head: Normocephalic and atraumatic.  Eyes: EOM are normal. Pupils are equal, round, and reactive to light.  Neck: Normal range of motion. Neck supple.  Cardiovascular: Regular rhythm, S1 normal, S2 normal, normal heart sounds and intact distal pulses.  Bradycardia present.   Pulmonary/Chest: Effort normal and breath sounds normal. No respiratory distress. She has no wheezes. She has no rales.  Abdominal: Soft. Bowel sounds are normal. She exhibits no distension. There is no tenderness. There is no rebound and no guarding.  Musculoskeletal: Normal range of motion. She exhibits no edema and no tenderness.  Neurological: She is alert and oriented to person, place, and time. She exhibits normal muscle tone. Coordination normal.  Skin: Skin is warm and dry. She is not diaphoretic.    ED Course   Procedures (including critical care time)  Labs Reviewed  COMPREHENSIVE METABOLIC PANEL - Abnormal; Notable for the following:    Albumin 2.8 (*)    Total Bilirubin 0.2 (*)    GFR calc non Af Amer 80 (*)    All other components within normal limits  CBC - Abnormal; Notable for the following:    RBC 3.65 (*)    Hemoglobin 11.4 (*)    HCT 33.4 (*)    All other components within normal limits  URINALYSIS, ROUTINE W REFLEX MICROSCOPIC - Abnormal; Notable for the following:    Specific Gravity, Urine <1.005 (*)    Hgb urine dipstick SMALL (*)    All other components within normal limits  URINE MICROSCOPIC-ADD ON  POCT I-STAT TROPONIN I   Dg Chest 2 View  09/30/2012   *RADIOLOGY REPORT*  Clinical Data: Hypertension.  Chest pain with inspiration.  CHEST - 2 VIEW  Comparison: None  Findings:  The heart and mediastinum are normal.  The lungs are clear.  Vascularity is normal.  No effusions.  No bony abnormalities.  IMPRESSION: Normal chest   Original Report Authenticated By: Paulina Fusi, M.D.   1. Chest  pain      Date: 09/30/2012  Rate: 41  Rhythm: sinus bradycardia  QRS Axis: normal  Intervals: normal  ST/T Wave abnormalities: normal  Conduction Disutrbances:none  Narrative Interpretation:   Old EKG Reviewed: unchanged from EKG obtained at [facility] prior to transfer   MDM  37 y.o. F G2P2002 with recent induced SVD 5 days ago transferred from [facility] 2/2 bradycardia.  Pt reports feeling chest tightness for past 5 days since delivery in addition to b/l LE edema and abdominal swelling.  She went to her  Ob/gyn appt today where she was noted to be hypertensive in 140s/70s and bradycardic in 40s.  Labwork including U/A was done, no proteinuria or other abnormalities- not felt to be pre-eclampsia per OB notes.  Pt was given lasix 40 mg and transferred for further workup.  She reports substernal chest tightness described as difficulty taking a deep breath.  No palpitations, pleuritic pain, or dyspnea.  No LE pain.  On exam, pt bradycardic in 40s but fully alert and asymptomatic.  BP stable in 140s/70s.  CV exam with bradycardia, regular rhythm, no m/r/g.  Lungs CTAB.  Trace edema in b/l LE with no erythema or tenderness.  Exam otherwise WNL.  EKG obtained shows sinus bradycardia with HR of 41, no evidence of heart block.  Will check troponin and discuss with cardiology.  Doubt PE given no pleuritic pain, dyspnea, or hypoxia.  Doubt cardiomyopathy with normal heart size on CXR obtained at [facility], no peripheral edema or pulmonary edema on CXR, normal BP.  Troponin negative.  Discussed with cardiology- as pt is hemodynamically stable and asymptomatic, stable for discharge with close f/u with her Ob/gyn for further monitoring of her BP, as well as f/u with PCP.  Pt did have some improvement of HR into the 60s.  Pt agreeable to this plan. ED return precautions given including chest pain, SOB, lightheadedness, or any other concerns.  Stable at discharge.  Discussed with attending  Dr. Bebe Shaggy.  Jodean Lima, MD 10/01/12 [phone]

## 2012-09-30 NOTE — ED Notes (Signed)
Dr. Wolfe at bedside.

## 2012-09-30 NOTE — Discharge Summary (Signed)
Obstetric Discharge Summary Reason for Admission: induction of labor Prenatal Procedures: ultrasound Intrapartum Procedures: spontaneous vaginal delivery Postpartum Procedures: none Complications-Operative and Postpartum: 2 degree perineal laceration Hemoglobin  Date Value Range Status  09/26/2012 11.0* 12.0 - 15.0 g/dL Final     HCT  Date Value Range Status  09/26/2012 31.1* 36.0 - 46.0 % Final    Physical Exam:  General: alert and cooperative Lochia: appropriate Uterine Fundus: firm Incision: healing well DVT Evaluation: No evidence of DVT seen on physical exam. Negative Homan's sign. No cords or calf tenderness.  Discharge Diagnoses: Term Pregnancy-delivered  Discharge Information: Date: 09/30/2012 Activity: pelvic rest Diet: routine Medications: PNV and Ibuprofen Condition: stable Instructions: refer to practice specific booklet Discharge to: home   Newborn Data: Live born female  Birth Weight: 7 lb 11.5 oz (3500 g) APGAR: 9, 9  Home with mother.  Danaysia Rader G 09/30/2012, 8:51 AM

## 2012-09-30 NOTE — Progress Notes (Signed)
[facility] here to transport patient. Patient upset and crying. States she needs to nurse baby. Baby to breast. Patient's husband states that they have had many questions that have not been answered. RN has been in room talking with them at frequent intervals. E. Key, NP in numerous times to discuss results and explain plan of care. [facility] waiting for patient to finish breastfeeding.

## 2012-09-30 NOTE — MAU Provider Note (Signed)
History     CSN: [id number]  Arrival date and time: 09/30/12 1233   First Provider Initiated Contact with Patient 09/30/12 1425      No chief complaint on file.  HPI Alyssa Martinez is 37 y.o. [id number] [redacted]w[redacted]d weeks presenting after seeing Dr. Renaldo Fiddler in the office.  She had uncomplicated pregnancy with the exception of thrombosed hemorrhoid.  Had elevated blood pressures.  Blood work, UA and CXR have been done.  She reports constipation, difficulty breathing deeply--worse with lying down, swelling in feet/ankles L>R, abdominal bloating.  She delivered 09/25/12.  States "everything feels off".  Breastfeeding.  Past Medical History  Diagnosis Date  . Anemia   . Hx of varicella   . Asthma   . AMA (advanced maternal age) multigravida 35+   . Hemorrhoid    Past Surgical History  Procedure Laterality Date  . Wisdom tooth extraction     Family History  Problem Relation Age of Onset  . Hypertension Mother   . Thyroid disease Mother   . Cancer Mother     breast  . Urolithiasis Father   . Urolithiasis Brother   . Cancer Maternal Grandmother     breast  . Cancer Paternal Grandmother     breast   History  Substance Use Topics  . Smoking status: Never Smoker   . Smokeless tobacco: Never Used  . Alcohol Use: No   Allergies: No Known Allergies  Prescriptions prior to admission  Medication Sig Dispense Refill  . albuterol (PROVENTIL HFA;VENTOLIN HFA) 108 (90 BASE) MCG/ACT inhaler Inhale 2 puffs into the lungs every 6 (six) hours as needed for wheezing.      . docusate sodium (COLACE) 100 MG capsule Take 200-300 mg by mouth at bedtime as needed for constipation.      Marland Kitchen ibuprofen (ADVIL,MOTRIN) 600 MG tablet Take 1 tablet (600 mg total) by mouth every 6 (six) hours.  30 tablet  0  . lidocaine (XYLOCAINE JELLY) 2 % jelly Apply to affected area daily prn  30 mL  1  . loratadine (CLARITIN) 10 MG tablet Take 10 mg by mouth daily as needed for allergies.       . Prenatal Vit-Fe Fumarate-FA  (PRENATAL MULTIVITAMIN) TABS Take 1 tablet by mouth daily at 12 noon.      . psyllium (METAMUCIL) 58.6 % packet Take 1 packet by mouth daily as needed (constipation).       Review of Systems  Constitutional: Negative for fever.  Respiratory:       Difficulty breathing deeply  Cardiovascular: Positive for leg swelling (ankles and feet L>R).       Elevated blood pressure  Gastrointestinal: Positive for constipation.       Abdominal swelling   Physical Exam   Blood pressure 143/78, pulse 46, temperature 98.1 F (36.7 C), temperature source Oral, resp. rate 18, last menstrual period 12/19/2011, SpO2 100.00%.  Physical Exam  Constitutional: She is oriented to person, place, and time. She appears well-developed and well-nourished. No distress.  HENT:  Head: Normocephalic.  Neck: Normal range of motion.  Musculoskeletal: She exhibits edema (bilateral pedal edema from ankle to foot).  Neurological: She is alert and oriented to person, place, and time.  Skin: Skin is warm and dry.  Psychiatric: She has a normal mood and affect. Her behavior is normal.    Results for orders placed during the hospital encounter of 09/30/12 (from the past 24 hour(s))  COMPREHENSIVE METABOLIC PANEL     Status: Abnormal  Collection Time    09/30/12  1:00 PM      Result Value Range   Sodium 138  135 - 145 mEq/L   Potassium 3.8  3.5 - 5.1 mEq/L   Chloride 105  96 - 112 mEq/L   CO2 23  19 - 32 mEq/L   Glucose, Bld 84  70 - 99 mg/dL   BUN 14  6 - 23 mg/dL   Creatinine, Ser 2.13  0.50 - 1.10 mg/dL   Calcium 9.2  8.4 - 08.6 mg/dL   Total Protein 6.0  6.0 - 8.3 g/dL   Albumin 2.8 (*) 3.5 - 5.2 g/dL   AST 19  0 - 37 U/L   ALT 15  0 - 35 U/L   Alkaline Phosphatase 86  39 - 117 U/L   Total Bilirubin 0.2 (*) 0.3 - 1.2 mg/dL   GFR calc non Af Amer 80 (*) >90 mL/min   GFR calc Af Amer >90  >90 mL/min  CBC     Status: Abnormal   Collection Time    09/30/12  1:00 PM      Result Value Range   WBC 6.3  4.0 -  10.5 K/uL   RBC 3.65 (*) 3.87 - 5.11 MIL/uL   Hemoglobin 11.4 (*) 12.0 - 15.0 g/dL   HCT 57.8 (*) 46.9 - 62.9 %   MCV 91.5  78.0 - 100.0 fL   MCH 31.2  26.0 - 34.0 pg   MCHC 34.1  30.0 - 36.0 g/dL   RDW 52.8  41.3 - 24.4 %   Platelets 174  150 - 400 K/uL  URINALYSIS, ROUTINE W REFLEX MICROSCOPIC     Status: Abnormal   Collection Time    09/30/12  1:15 PM      Result Value Range   Color, Urine YELLOW  YELLOW   APPearance CLEAR  CLEAR   Specific Gravity, Urine <1.005 (*) 1.005 - 1.030   pH 6.0  5.0 - 8.0   Glucose, UA NEGATIVE  NEGATIVE mg/dL   Hgb urine dipstick SMALL (*) NEGATIVE   Bilirubin Urine NEGATIVE  NEGATIVE   Ketones, ur NEGATIVE  NEGATIVE mg/dL   Protein, ur NEGATIVE  NEGATIVE mg/dL   Urobilinogen, UA 0.2  0.0 - 1.0 mg/dL   Nitrite NEGATIVE  NEGATIVE   Leukocytes, UA NEGATIVE  NEGATIVE  URINE MICROSCOPIC-ADD ON     Status: None   Collection Time    09/30/12  1:15 PM      Result Value Range   Squamous Epithelial / LPF RARE  RARE   WBC, UA 0-2  <3 WBC/hpf    Study Result    *RADIOLOGY REPORT*  Clinical Data: Hypertension. Chest pain with inspiration.  CHEST - 2 VIEW  Comparison: None  Findings: The heart and mediastinum are normal. The lungs are  clear. Vascularity is normal. No effusions. No bony  abnormalities.  IMPRESSION:  Normal chest  Original Report Authenticated By: Paulina Fusi, M.D.    Patient Vitals for the past 24 hrs:  BP Temp Temp src Pulse Resp SpO2  09/30/12 1531 143/78 mmHg - - 46 - 100 %  09/30/12 1439 142/83 mmHg - - 41 - -  09/30/12 1416 149/86 mmHg - - 40 - -  09/30/12 1320 149/82 mmHg 98.1 F (36.7 C) Oral 48 18 100 %  EKG: marked sinus bradycardia.  MAU Course  Procedures  MDM Called Dr. Vincente Poli and left message to call.  She called back saying she just finished  a C-Section.  MSE and VS, labs and CXR reported.  Order given for Lasix 20mg  po now, EKG.   Reported to Dr. Vincente Poli.  Order given to transfer to [facility] for further  evaluation of abnormal EKG Explained plan of care to the patient and her husband.   16:30  Call make to Dr. Purvis Sheffield at [facility] who accepted transfer of this patient.  Assessment and Plan  A:  Elevated blood pressures at 5 days post partum      Marked sinus bradycardia      Pedal  Edema      Pre-eclampsia ruled out   P:  Transfer to [facility] for further evaluation  KEY,EVE M 09/30/2012, 4:36 PM

## 2012-10-01 ENCOUNTER — Emergency Department (HOSPITAL_COMMUNITY)
Admission: EM | Admit: 2012-10-01 | Discharge: 2012-10-02 | Disposition: A | Discharge status: Home / Self Care | Payer: BC Managed Care – PPO | Attending: Emergency Medicine | Admitting: Emergency Medicine

## 2012-10-01 ENCOUNTER — Emergency Department (HOSPITAL_COMMUNITY): Payer: BC Managed Care – PPO

## 2012-10-01 ENCOUNTER — Encounter (HOSPITAL_COMMUNITY): Payer: Self-pay | Admitting: *Deleted

## 2012-10-01 DIAGNOSIS — Z8619 Personal history of other infectious and parasitic diseases: Secondary | ICD-10-CM | POA: Insufficient documentation

## 2012-10-01 DIAGNOSIS — D649 Anemia, unspecified: Secondary | ICD-10-CM | POA: Insufficient documentation

## 2012-10-01 DIAGNOSIS — R072 Precordial pain: Secondary | ICD-10-CM | POA: Insufficient documentation

## 2012-10-01 DIAGNOSIS — R0600 Dyspnea, unspecified: Secondary | ICD-10-CM

## 2012-10-01 DIAGNOSIS — R609 Edema, unspecified: Secondary | ICD-10-CM | POA: Insufficient documentation

## 2012-10-01 DIAGNOSIS — R079 Chest pain, unspecified: Secondary | ICD-10-CM

## 2012-10-01 DIAGNOSIS — Z8679 Personal history of other diseases of the circulatory system: Secondary | ICD-10-CM | POA: Insufficient documentation

## 2012-10-01 DIAGNOSIS — J45901 Unspecified asthma with (acute) exacerbation: Secondary | ICD-10-CM | POA: Insufficient documentation

## 2012-10-01 DIAGNOSIS — Z79899 Other long term (current) drug therapy: Secondary | ICD-10-CM | POA: Insufficient documentation

## 2012-10-01 LAB — CBC WITH DIFFERENTIAL/PLATELET
Hemoglobin: 13.5 g/dL (ref 12.0–15.0)
Lymphs Abs: 1.4 10*3/uL (ref 0.7–4.0)
Monocytes Relative: 6 % (ref 3–12)
Neutro Abs: 5.3 10*3/uL (ref 1.7–7.7)
Neutrophils Relative %: 72 % (ref 43–77)
RBC: 4.29 MIL/uL (ref 3.87–5.11)
WBC: 7.5 10*3/uL (ref 4.0–10.5)

## 2012-10-01 LAB — BASIC METABOLIC PANEL
CO2: 23 mEq/L (ref 19–32)
GFR calc non Af Amer: 88 mL/min — ABNORMAL LOW (ref >90)
Glucose, Bld: 102 mg/dL — ABNORMAL HIGH (ref 70–99)
Potassium: 3.8 mEq/L (ref 3.5–5.1)
Sodium: 138 mEq/L (ref 135–145)

## 2012-10-01 LAB — TROPONIN I: Troponin I: <0.3 ng/mL (ref <0.30)

## 2012-10-01 MED ORDER — IOHEXOL 350 MG/ML SOLN
100.0000 mL | Freq: Once | INTRAVENOUS | Status: AC | PRN
  Administered 2012-10-01: 100 mL via INTRAVENOUS

## 2012-10-01 NOTE — ED Provider Notes (Signed)
CSN: [id number]     Arrival date & time 10/01/12  2008 History     First MD Initiated Contact with Patient 10/01/12 2149     Chief Complaint  Patient presents with  . Chest Pain   (Consider location/radiation/quality/duration/timing/severity/associated sxs/prior Treatment) HPI Comments: Pt is a 37 y.o. female with Pmhx as above (recently post-partum) who presents with 4 days of midsternal CP, SOB on exertion, mild LE edema.  She has been seen by [facility] hospital for this, given dose of lasix yesterday.  She saw PCP today and was sent to ED for concern for PE. She has also had nml CXR w/ EKG that showed sinus bradycardia.  Patient is a 37 y.o. female presenting with chest pain. The history is provided by the patient. No language interpreter was used.  Chest Pain Pain location:  Substernal area Pain quality: tightness   Pain radiates to:  Does not radiate Pain radiates to the back: no   Pain severity:  Moderate Onset quality:  Unable to specify Duration:  4 days Timing:  Intermittent Progression:  Waxing and waning Chronicity:  New Context comment:  Exertion Relieved by:  Rest Worsened by:  Exertion Ineffective treatments:  None tried Associated symptoms: shortness of breath   Associated symptoms: no abdominal pain, no anorexia, no back pain, no cough, no diaphoresis, no dysphagia, no fatigue, no fever, no headache, no nausea, no numbness, no palpitations, no syncope, not vomiting and no weakness   Risk factors comment:  Recent delivery   Past Medical History  Diagnosis Date  . Anemia   . Hx of varicella   . Asthma   . AMA (advanced maternal age) multigravida 35+   . Hemorrhoid    Past Surgical History  Procedure Laterality Date  . Wisdom tooth extraction     Family History  Problem Relation Age of Onset  . Hypertension Mother   . Thyroid disease Mother   . Cancer Mother     breast  . Urolithiasis Father   . Urolithiasis Brother   . Cancer Maternal Grandmother      breast  . Cancer Paternal Grandmother     breast   History  Substance Use Topics  . Smoking status: Never Smoker   . Smokeless tobacco: Never Used  . Alcohol Use: No   OB History   Grav Para Term Preterm Abortions TAB SAB Ect Mult Living   2 2 2       2      Review of Systems  Constitutional: Negative for fever, chills, diaphoresis, activity change, appetite change and fatigue.  HENT: Negative for congestion, sore throat, facial swelling, rhinorrhea, trouble swallowing, neck pain and neck stiffness.   Eyes: Negative for photophobia and discharge.  Respiratory: Positive for shortness of breath. Negative for cough and chest tightness.   Cardiovascular: Positive for chest pain and leg swelling. Negative for palpitations and syncope.  Gastrointestinal: Negative for nausea, vomiting, abdominal pain, diarrhea and anorexia.  Endocrine: Negative for polydipsia and polyuria.  Genitourinary: Negative for dysuria, frequency, difficulty urinating and pelvic pain.  Musculoskeletal: Negative for back pain and arthralgias.  Skin: Negative for color change and wound.  Allergic/Immunologic: Negative for immunocompromised state.  Neurological: Negative for facial asymmetry, weakness, numbness and headaches.  Hematological: Does not bruise/bleed easily.  Psychiatric/Behavioral: Negative for confusion and agitation.    Allergies  Review of patient's allergies indicates no known allergies.  Home Medications   Current Outpatient Rx  Name  Route  Sig  Dispense  Refill  . albuterol (PROVENTIL HFA;VENTOLIN HFA) 108 (90 BASE) MCG/ACT inhaler   Inhalation   Inhale 2 puffs into the lungs every 6 (six) hours as needed for wheezing.         . docusate sodium (COLACE) 100 MG capsule   Oral   Take 200-300 mg by mouth at bedtime as needed for constipation.         Marland Kitchen ibuprofen (ADVIL,MOTRIN) 600 MG tablet   Oral   Take 1 tablet (600 mg total) by mouth every 6 (six) hours.   30 tablet   0   .  lidocaine (XYLOCAINE JELLY) 2 % jelly      Apply to affected area daily prn   30 mL   1   . loratadine (CLARITIN) 10 MG tablet   Oral   Take 10 mg by mouth daily as needed for allergies.          . polyethylene glycol (MIRALAX / GLYCOLAX) packet   Oral   Take 17 g by mouth daily.         . Prenatal Vit-Fe Fumarate-FA (PRENATAL MULTIVITAMIN) TABS   Oral   Take 1 tablet by mouth daily at 12 noon.         . psyllium (METAMUCIL) 58.6 % packet   Oral   Take 1 packet by mouth daily as needed (constipation).          BP 145/86  Pulse 45  Temp(Src) 98.1 F (36.7 C) (Oral)  Resp 18  Ht 5\' 8"  (1.727 m)  Wt 156 lb (70.761 kg)  BMI 23.73 kg/m2  SpO2 100%  LMP 12/19/2011 Physical Exam  Constitutional: She is oriented to person, place, and time. She appears well-developed and well-nourished. No distress.  HENT:  Head: Normocephalic and atraumatic.  Mouth/Throat: No oropharyngeal exudate.  Eyes: Pupils are equal, round, and reactive to light.  Neck: Normal range of motion. Neck supple.  Cardiovascular: Regular rhythm and normal heart sounds.  Bradycardia present.  Exam reveals no gallop and no friction rub.   No murmur heard. Pulmonary/Chest: Effort normal and breath sounds normal. No respiratory distress. She has no decreased breath sounds. She has no wheezes. She has no rhonchi. She has no rales.  Abdominal: Soft. Bowel sounds are normal. She exhibits no distension and no mass. There is no tenderness. There is no rebound and no guarding.  Musculoskeletal: Normal range of motion. She exhibits no edema and no tenderness.  Neurological: She is alert and oriented to person, place, and time.  Skin: Skin is warm and dry.  Psychiatric: She has a normal mood and affect.    Date: 10/01/2012  Rate: 44  Rhythm: sinus bradycardia  QRS Axis: normal  Intervals: normal  ST/T Wave abnormalities: normal  Conduction Disutrbances:none  Narrative Interpretation:   Old EKG Reviewed:  unchanged   ED Course   Procedures (including critical care time)  Labs Reviewed  PRO B NATRIURETIC PEPTIDE - Abnormal; Notable for the following:    Pro B Natriuretic peptide (BNP) 233.0 (*)    All other components within normal limits  BASIC METABOLIC PANEL - Abnormal; Notable for the following:    Glucose, Bld 102 (*)    GFR calc non Af Amer 88 (*)    All other components within normal limits  CBC WITH DIFFERENTIAL  TROPONIN I   Dg Chest 2 View  09/30/2012   *RADIOLOGY REPORT*  Clinical Data: Hypertension.  Chest pain with inspiration.  CHEST - 2 VIEW  Comparison: None  Findings:  The heart and mediastinum are normal.  The lungs are clear.  Vascularity is normal.  No effusions.  No bony abnormalities.  IMPRESSION: Normal chest   Original Report Authenticated By: Paulina Fusi, M.D.   Ct Angio Chest Pe W/cm &/or Wo Cm  10/02/2012   *RADIOLOGY REPORT*  Clinical Data: Shortness of breath.  Evaluate for potential pulmonary embolism.  CT ANGIOGRAPHY CHEST  Technique:  Multidetector CT imaging of the chest using the standard protocol during bolus administration of intravenous contrast. Multiplanar reconstructed images including MIPs were obtained and reviewed to evaluate the vascular anatomy.  Contrast: OMNIPAQUE IOHEXOL 350 MG/ML SOLN  Comparison: No priors.  Findings:  Mediastinum: There are no filling defects within the pulmonary arterial tree to suggest underlying pulmonary embolism.  Heart size is boarder line enlarged.  There is no significant pericardial fluid, thickening or pericardial calcification. No pathologically enlarged mediastinal or hilar lymph nodes. Esophagus is unremarkable in appearance.  Lungs/Pleura: Inferior aspect of the lung bases are incompletely visualized.  Within the visualized thorax there is no acute consolidative airspace disease, no pleural effusions, and no suspicious-appearing pulmonary nodules or masses.  Upper Abdomen: Unremarkable.  Musculoskeletal: There  are no aggressive appearing lytic or blastic lesions noted in the visualized portions of the skeleton.  IMPRESSION: 1.  No evidence of pulmonary embolism. 2.  No acute findings in the thorax to account for the patient's symptoms. 3.  Borderline cardiomegaly.   Original Report Authenticated By: Trudie Reed, M.D.   1. Chest pain   2. Dyspnea     MDM  Pt is a 37 y.o. female with Pmhx as above (recently post-partum) who presents with 4 days of midsternal CP, SOB on exertion, mild LE edema.  She has been seen by [facility] hospital for this, given dose of lasix yesterday.  She saw PCP today and was sent to ED for concern for PE. She has also had nml CXR w/ EKG that showed sinus bradycardia.  Here, pt again bradycardic, mild hypertension.  Lungs clear, no LE edema.  CT PE study ordered and was negative.  BNP 233, trop not elevated.  Borderline cardiomegaly on CTA.  I believe pt stable for outpt f/u tomorrow with OB and [place] cardiology as scheduled.  I do not feel she requires admission and is safe for continued outpt w/u.  She may have mild cardiomyopathy causing symptoms, but doubt ischemic chest pain, PE, pna, ptx, overt heart failure, preeclampsia/HEELP syndrome.   1. Chest pain   2. Dyspnea        Shanna Cisco, MD 10/02/12 0111

## 2012-10-01 NOTE — ED Notes (Signed)
Patient presents for c/o nonradiating, upper/mid chest pain x 2 days. Patient is 6 days postpartum. delivered 40 week healthy baby. No complications during pregnancy nor delivery. Pt has been evaluated by OBGYN and PCP. Concern for PE and sent here for further evaluation. Patient has had bilateral lower extremity edema, which was treated with Lasix PO yesterday. Patient reports improvement in swelling. Also has some exertional SOB. Denies palpitations, headache, dizziness, diaphoresis, fevers, sweats or chills.

## 2012-10-01 NOTE — ED Notes (Signed)
The pt has  Has bi-lateral lower chest pain since yesterday.  She just just delivered 6 days ago..   She is very anxious she feels like she cannot take a deep breath and her pain radiates posteriorly also./  She has been seen by her ob doctor and her regular medical doctor that sent her here

## 2012-10-02 ENCOUNTER — Other Ambulatory Visit: Payer: Self-pay | Admitting: *Deleted

## 2012-10-02 ENCOUNTER — Ambulatory Visit (INDEPENDENT_AMBULATORY_CARE_PROVIDER_SITE_OTHER): Payer: BC Managed Care – PPO | Admitting: Cardiovascular Disease

## 2012-10-02 ENCOUNTER — Encounter: Payer: Self-pay | Admitting: Cardiovascular Disease

## 2012-10-02 ENCOUNTER — Ambulatory Visit (HOSPITAL_BASED_OUTPATIENT_CLINIC_OR_DEPARTMENT_OTHER): Payer: BC Managed Care – PPO | Admitting: Radiology

## 2012-10-02 VITALS — BP 122/72 | HR 50 | Ht 68.0 in | Wt 152.1 lb

## 2012-10-02 DIAGNOSIS — R0602 Shortness of breath: Secondary | ICD-10-CM

## 2012-10-02 DIAGNOSIS — R001 Bradycardia, unspecified: Secondary | ICD-10-CM

## 2012-10-02 DIAGNOSIS — R072 Precordial pain: Secondary | ICD-10-CM

## 2012-10-02 DIAGNOSIS — R0600 Dyspnea, unspecified: Secondary | ICD-10-CM

## 2012-10-02 DIAGNOSIS — R0609 Other forms of dyspnea: Secondary | ICD-10-CM

## 2012-10-02 DIAGNOSIS — R609 Edema, unspecified: Secondary | ICD-10-CM

## 2012-10-02 DIAGNOSIS — R079 Chest pain, unspecified: Secondary | ICD-10-CM

## 2012-10-02 DIAGNOSIS — I498 Other specified cardiac arrhythmias: Secondary | ICD-10-CM

## 2012-10-02 LAB — TSH: TSH: 1.81 u[IU]/mL (ref 0.35–5.50)

## 2012-10-02 MED ORDER — FUROSEMIDE 40 MG PO TABS: ORAL_TABLET | ORAL | Status: DC

## 2012-10-02 NOTE — Progress Notes (Signed)
Echocardiogram performed.  

## 2012-10-02 NOTE — ED Provider Notes (Signed)
I have personally seen and examined the patient.  I have discussed the plan of care with the resident.  I have reviewed the documentation on PMH/FH/Soc. History.  I have reviewed the documentation of the resident and agree.  I have reviewed and agree with the ECG interpretation(s) documented by the resident.   Joya Gaskins, MD 10/02/12 [phone]

## 2012-10-02 NOTE — Patient Instructions (Addendum)
Your physician recommends that you schedule a follow-up appointment in:  About 4 weeks.   Your physician has recommended you make the following change in your medication:  Take furosemide 40 mg by mouth today and Saturday. May take on Sunday if needed.   Call us if you are not feeling better next week.

## 2012-10-02 NOTE — Progress Notes (Signed)
History of Present Illness: 37 yo female with history of anemia, asthma, recent pregnancy with delivery of a healthy infant 6 days ago who is here today for cardiac evaluation. She delivered on 09/25/12. Since then she has not been feeling well. She has had c/o chest discomfort, fatigue and SOB. Seen in the ED 09/30/12 and had mild anemia, normal BMET, negative troponin. EKG with sinus bradycardia, rate 44 bpm. CTA chest this am with no evidence of PE. She is added onto my schedule after a call from [facility]. Echo this am in our office is overall normal with normal LV systolic function.   She has had swelling in her ankles since delivery. She has had some abdominal swelling and pain. She has had some chest tightness but this has gotten better. Some dyspnea with exertion but improving. Given one time dose of Lasix earlier this week and she diuresed well with 10 lb weight loss. Ankle swelling resolved now.   Primary Care Physician/OBGYN: Zelphia Cairo  Past Medical History  Diagnosis Date  . Anemia   . Hx of varicella   . Asthma   . AMA (advanced maternal age) multigravida 35+   . Hemorrhoid     Past Surgical History  Procedure Laterality Date  . Wisdom tooth extraction      Current Outpatient Prescriptions  Medication Sig Dispense Refill  . albuterol (PROVENTIL HFA;VENTOLIN HFA) 108 (90 BASE) MCG/ACT inhaler Inhale 2 puffs into the lungs every 6 (six) hours as needed for wheezing.      . docusate sodium (COLACE) 100 MG capsule Take 200-300 mg by mouth at bedtime as needed for constipation.      Marland Kitchen ibuprofen (ADVIL,MOTRIN) 600 MG tablet Take 1 tablet (600 mg total) by mouth every 6 (six) hours.  30 tablet  0  . lidocaine (XYLOCAINE JELLY) 2 % jelly Apply to affected area daily prn  30 mL  1  . loratadine (CLARITIN) 10 MG tablet Take 10 mg by mouth daily as needed for allergies.       . polyethylene glycol (MIRALAX / GLYCOLAX) packet Take 17 g by mouth daily.      . Prenatal Vit-Fe  Fumarate-FA (PRENATAL MULTIVITAMIN) TABS Take 1 tablet by mouth daily at 12 noon.      . psyllium (METAMUCIL) 58.6 % packet Take 1 packet by mouth daily as needed (constipation).       No current facility-administered medications for this visit.    No Known Allergies  History   Social History  . Marital Status: Married    Spouse Name: N/A    Number of Children: 2  . Years of Education: N/A   Occupational History  . Homemaker    Social History Main Topics  . Smoking status: Never Smoker   . Smokeless tobacco: Never Used  . Alcohol Use: No  . Drug Use: No  . Sexual Activity: Yes    Birth Control/ Protection: None   Other Topics Concern  . Not on file   Social History Narrative  . No narrative on file    Family History  Problem Relation Age of Onset  . Hypertension Mother   . Thyroid disease Mother   . Cancer Mother     breast  . Urolithiasis Father   . Urolithiasis Brother   . Cancer Maternal Grandmother     breast  . Cancer Paternal Grandmother     breast  . Heart attack Paternal Grandfather     Review of Systems:  As stated in the HPI and otherwise negative.   BP 122/72  Pulse 50  Ht 5\' 8"  (1.727 m)  Wt 152 lb 1.9 oz (69.001 kg)  BMI 23.14 kg/m2  SpO2 99%  LMP 12/19/2011  Physical Examination: General: Well developed, well nourished, NAD HEENT: OP clear, mucus membranes moist SKIN: warm, dry. No rashes. Neuro: No focal deficits Musculoskeletal: Muscle strength 5/5 all ext Psychiatric: Mood and affect normal Neck: No JVD, no carotid bruits, no thyromegaly, no lymphadenopathy. Lungs:Clear bilaterally, no wheezes, rhonci, crackles Cardiovascular: Huston Foley, regular rhythm. No murmurs, gallops or rubs. Abdomen:Soft. Bowel sounds present. Non-tender.  Extremities: No lower extremity edema. Pulses are 2 + in the bilateral DP/PT.  EKG: Sinus brady, rate 48 bpm.   Echo 10/02/12: Left ventricle: The cavity size was normal. Wall thickness was normal.  Systolic function was normal. The estimated ejection fraction was in the range of 60% to 65%.  Assessment and Plan:   1. Dyspnea/Bradycardia: Sinus brady. Echo normal. Will check TSH today. Still mild volume overload. Will give Lasix 40 mg po Qdaily for 2 days. Heart rate responded normally to walking today from 60 bpm up to 68 bpm. I do not think further testing is indicated. If she has continued symptoms next week, will arrange exercise treadmill test to see heart rate response at peak exercise.

## 2012-10-22 ENCOUNTER — Telehealth: Payer: Self-pay | Admitting: Cardiovascular Disease

## 2012-10-22 NOTE — Telephone Encounter (Signed)
New problem    Went to see PCP this am and was told to call discuss if appt she has on 9/10 should she keep or move up

## 2012-10-22 NOTE — Telephone Encounter (Signed)
No changes in regards to her sinus brady if no dizziness. OK to use Lasix as needed for edema. cdm

## 2012-10-22 NOTE — Telephone Encounter (Signed)
Spoke with pt and gave her information from Dr. Clifton James. She is planning on being here for appt next week.

## 2012-10-22 NOTE — Telephone Encounter (Signed)
Spoke with pt. She reports she has been checking heart rate and it usually runs upper 50's to low 60's. Today at primary care provider's office heart rate was 80 with walking but then went down to 40's. Pt did not have symptoms with this. She states this heart rate was when she was lying down and heart rate went up to 58 when she sat up. States she thinks she is still carrying extra fluid. She took 1 dose of furosemide after office visit with Dr. Clifton James and felt better so OB/GYN told her not to take additional dose.  When she saw primary MD today she was told to take second dose of Lasix today which pt has done. Pt has scheduled appt with Dr. Clifton James on October 28, 2012 but states primary MD told her to contact us to see if she should be seen sooner.

## 2012-10-22 NOTE — Telephone Encounter (Signed)
Left message to call back  

## 2012-10-22 NOTE — Telephone Encounter (Signed)
Follow Up ° ° °Pt returning call from earlier. Please call back. °

## 2012-10-28 ENCOUNTER — Encounter: Payer: Self-pay | Admitting: Cardiovascular Disease

## 2012-10-28 ENCOUNTER — Ambulatory Visit (INDEPENDENT_AMBULATORY_CARE_PROVIDER_SITE_OTHER): Payer: BC Managed Care – PPO | Admitting: Cardiovascular Disease

## 2012-10-28 VITALS — BP 111/80 | HR 55 | Ht 68.0 in | Wt 150.0 lb

## 2012-10-28 DIAGNOSIS — R001 Bradycardia, unspecified: Secondary | ICD-10-CM

## 2012-10-28 DIAGNOSIS — R079 Chest pain, unspecified: Secondary | ICD-10-CM

## 2012-10-28 DIAGNOSIS — I498 Other specified cardiac arrhythmias: Secondary | ICD-10-CM

## 2012-10-28 NOTE — Patient Instructions (Addendum)
Your physician recommends that you schedule a follow-up appointment as needed with Dr. Clifton James  Your physician has recommended that you wear a holter monitor. Holter monitors are medical devices that record the heart's electrical activity. Doctors most often use these monitors to diagnose arrhythmias. Arrhythmias are problems with the speed or rhythm of the heartbeat. The monitor is a small, portable device. You can wear one while you do your normal daily activities. This is usually used to diagnose what is causing palpitations/syncope (passing out).  Your physician has requested that you have an exercise tolerance test.Can be done with NP or PA.  For further information please visit [email or web address] Please also follow instruction sheet, as given.

## 2012-10-28 NOTE — Progress Notes (Signed)
History of Present Illness: 37 yo female with history of anemia, asthma, recent pregnancy with delivery of a healthy infant August 2014 who is here today for cardiac follow up. She delivered on 09/25/12. I saw her on 10/02/12 as a new patient and she c/o not been feeling well with chest discomfort, fatigue and SOB. Seen in the ED 09/30/12 and had mild anemia, normal BMET, negative troponin. EKG with sinus bradycardia, rate 44 bpm. CTA chest 10/02/12 with no evidence of PE. Echo 10/02/12 overall normal with normal LV systolic function. She had LE edema that responded well to Lasix. Heart rate responded normally to walking from 60 bpm up to 68 bpm. TSH was normal.   She is here today for follow up. She continues to have a feeling of constant heaviness in her chest, across her sternum. No resolution with position. No worsening with exertion. No associated SOB. She has had several episodes of dizziness. Resting heart rate has been in the 40-50s. LE edema better with use of Lasix x 2 weeks. Still using prn. No palpitations.   Primary Care Physician: Payton Spark (Dr. Cyndia Bent) OBGYN: Zelphia Cairo   Past Medical History  Diagnosis Date  . Anemia   . Hx of varicella   . Asthma   . AMA (advanced maternal age) multigravida 35+   . Hemorrhoid     Past Surgical History  Procedure Laterality Date  . Wisdom tooth extraction      Current Outpatient Prescriptions  Medication Sig Dispense Refill  . albuterol (PROVENTIL HFA;VENTOLIN HFA) 108 (90 BASE) MCG/ACT inhaler Inhale 2 puffs into the lungs every 6 (six) hours as needed for wheezing.      . docusate sodium (COLACE) 100 MG capsule Take 200-300 mg by mouth at bedtime as needed for constipation.      . furosemide (LASIX) 40 MG tablet Take as directed  3 tablet  0  . furosemide (LASIX) 40 MG tablet       . lidocaine (XYLOCAINE JELLY) 2 % jelly Apply to affected area daily prn  30 mL  1  . loratadine (CLARITIN) 10 MG tablet Take 10 mg by  mouth daily as needed for allergies.       . polyethylene glycol (MIRALAX / GLYCOLAX) packet Take by mouth.      . Prenatal Vit-Fe Fumarate-FA (PRENATAL MULTIVITAMIN) TABS Take 1 tablet by mouth daily at 12 noon.      . psyllium (METAMUCIL) 58.6 % packet Take 1 packet by mouth daily as needed (constipation).       No current facility-administered medications for this visit.    No Known Allergies  History   Social History  . Marital Status: Married    Spouse Name: N/A    Number of Children: 2  . Years of Education: N/A   Occupational History  . Homemaker    Social History Main Topics  . Smoking status: Never Smoker   . Smokeless tobacco: Never Used  . Alcohol Use: No  . Drug Use: No  . Sexual Activity: Yes    Birth Control/ Protection: None   Other Topics Concern  . Not on file   Social History Narrative  . No narrative on file    Family History  Problem Relation Age of Onset  . Hypertension Mother   . Thyroid disease Mother   . Cancer Mother     breast  . Urolithiasis Father   . Urolithiasis Brother   . Cancer Maternal Grandmother  breast  . Cancer Paternal Grandmother     breast  . Heart attack Paternal Grandfather     Review of Systems:  As stated in the HPI and otherwise negative.   BP 111/80  Pulse 55  Ht 5\' 8"  (1.727 m)  Wt 150 lb (68.04 kg)  BMI 22.81 kg/m2  Physical Examination: General: Well developed, well nourished, NAD HEENT: OP clear, mucus membranes moist SKIN: warm, dry. No rashes. Neuro: No focal deficits Musculoskeletal: Muscle strength 5/5 all ext Psychiatric: Mood and affect normal Neck: No JVD, no carotid bruits, no thyromegaly, no lymphadenopathy. Lungs:Clear bilaterally, no wheezes, rhonci, crackles Cardiovascular: Huston Foley, regular rhythm. No murmurs, gallops or rubs. Abdomen:Soft. Bowel sounds present. Non-tender.  Extremities: No lower extremity edema. Pulses are 2 + in the bilateral DP/PT.  EKG: Sinus brady, rate 48 bpm.    Echo 10/02/12: Left ventricle: The cavity size was normal. Wall thickness was normal. Systolic function was normal. The estimated ejection fraction was in the range of 60% to 65%.  Assessment and Plan:   1. Bradycardia:  Echo normal. TSH normal. Will arrange exercise treadmill stress test to see heart rate response to brisk exercise. I do not think her bradycardia is contributing to her symptoms of chest pressure or dizziness. Will also arrange 48 hour monitor to exclude pauses or other arrythmias. As above, her LV function is normal. No evidence of peripartum cardiomyopathy. This does not sound like coronary artery dissection which would have an entirely different presentation. Low risk for obstructive CAD.

## 2012-10-30 ENCOUNTER — Ambulatory Visit (INDEPENDENT_AMBULATORY_CARE_PROVIDER_SITE_OTHER): Payer: BC Managed Care – PPO

## 2012-10-30 DIAGNOSIS — I498 Other specified cardiac arrhythmias: Secondary | ICD-10-CM

## 2012-10-30 DIAGNOSIS — R001 Bradycardia, unspecified: Secondary | ICD-10-CM

## 2012-11-03 ENCOUNTER — Other Ambulatory Visit (HOSPITAL_COMMUNITY): Payer: Self-pay | Admitting: Obstetrics and Gynecology

## 2012-11-03 DIAGNOSIS — R140 Abdominal distension (gaseous): Secondary | ICD-10-CM

## 2012-11-03 DIAGNOSIS — R109 Unspecified abdominal pain: Secondary | ICD-10-CM

## 2012-11-05 ENCOUNTER — Ambulatory Visit (HOSPITAL_COMMUNITY)
Admission: RE | Admit: 2012-11-05 | Discharge: 2012-11-05 | Disposition: A | Discharge status: Home / Self Care | Payer: BC Managed Care – PPO | Source: Ambulatory Visit | Attending: Obstetrics and Gynecology | Admitting: Obstetrics and Gynecology

## 2012-11-05 DIAGNOSIS — N200 Calculus of kidney: Secondary | ICD-10-CM | POA: Insufficient documentation

## 2012-11-05 DIAGNOSIS — R109 Unspecified abdominal pain: Secondary | ICD-10-CM | POA: Insufficient documentation

## 2012-11-05 DIAGNOSIS — R142 Eructation: Secondary | ICD-10-CM | POA: Insufficient documentation

## 2012-11-05 DIAGNOSIS — R141 Gas pain: Secondary | ICD-10-CM | POA: Insufficient documentation

## 2012-11-05 DIAGNOSIS — R140 Abdominal distension (gaseous): Secondary | ICD-10-CM

## 2012-11-05 MED ORDER — IOHEXOL 300 MG/ML  SOLN
100.0000 mL | Freq: Once | INTRAMUSCULAR | Status: AC | PRN
  Administered 2012-11-05: 100 mL via INTRAVENOUS

## 2012-11-05 NOTE — Progress Notes (Signed)
Placed a ecardio  48 hr holter

## 2012-11-20 ENCOUNTER — Ambulatory Visit: Payer: BC Managed Care – PPO | Admitting: Cardiovascular Disease

## 2012-11-20 ENCOUNTER — Encounter (INDEPENDENT_AMBULATORY_CARE_PROVIDER_SITE_OTHER): Payer: BC Managed Care – PPO | Admitting: General Surgery

## 2012-11-26 ENCOUNTER — Encounter: Payer: BC Managed Care – PPO | Admitting: Physician Assistant

## 2012-11-27 ENCOUNTER — Ambulatory Visit (INDEPENDENT_AMBULATORY_CARE_PROVIDER_SITE_OTHER): Payer: Self-pay | Admitting: Nurse Practitioner

## 2012-11-27 ENCOUNTER — Encounter: Payer: Self-pay | Admitting: Nurse Practitioner

## 2012-11-27 VITALS — BP 121/72 | HR 68

## 2012-11-27 DIAGNOSIS — I498 Other specified cardiac arrhythmias: Secondary | ICD-10-CM

## 2012-11-27 DIAGNOSIS — R0989 Other specified symptoms and signs involving the circulatory and respiratory systems: Secondary | ICD-10-CM

## 2012-11-27 DIAGNOSIS — R001 Bradycardia, unspecified: Secondary | ICD-10-CM

## 2012-11-27 DIAGNOSIS — R079 Chest pain, unspecified: Secondary | ICD-10-CM

## 2012-11-27 NOTE — Progress Notes (Signed)
Exercise Treadmill Test  Pre-Exercise Testing Evaluation Rhythm: sinus bradycardia  Rate: 50 bpm     Test  Exercise Tolerance Test Ordering MD: Melene Muller, MD  Interpreting MD: Norma Fredrickson, NP  Unique Test No: 1  Treadmill:  1  Indication for ETT: chest pain - rule out ischemia  Contraindication to ETT: No   Stress Modality: exercise - treadmill  Cardiac Imaging Performed: non   Protocol: standard Bruce - maximal  Max BP:  180/74  Max MPHR (bpm):  184 85% MPR (bpm):  156  MPHR obtained (bpm):  169 % MPHR obtained:  91%  Reached 85% MPHR (min:sec):  9:22 Total Exercise Time (min-sec):  10 minutes  Workload in METS:  11.6 Borg Scale: 15  Reason ETT Terminated:  patient's desire to stop    ST Segment Analysis At Rest: normal ST segments - no evidence of significant ST depression With Exercise: no evidence of significant ST depression  Other Information Arrhythmia:  No; rare PVC with exercise noted Angina during ETT:  absent (0); mild upper left neck pain and mild midsternal chest pain.  Quality of ETT:  diagnostic  ETT Interpretation:  normal - no evidence of ischemia by ST analysis  Comments: Patient presents today for routine GXT. Has had atypical chest pain that started after the birth of her 2nd child. Has had an extensive evaluation. Normal echo. Negative CTA of the chest for PE. Has been concerned about low heart rate.   Today, she exercised on the standard Bruce protocol for a total of 10 minutes. Good exercise tolerance. Adequate blood pressure response. Clinically with mild chest discomfort - also reports history of asthma and used inhalers in the past prior to exercise. Maximum HR of 169 noted. No significiant arrhythmia - rare PVC noted. EKG negative for ischemia.   Recommendations: Reassurance. Ok to start walking program.  See back prn.   Call the [facility] office at [phone] if you have any questions, problems or  concerns.   Rosalio Macadamia, RN, ANP-C [facility] [address] [place], [place]  [address]

## 2012-12-01 ENCOUNTER — Ambulatory Visit (INDEPENDENT_AMBULATORY_CARE_PROVIDER_SITE_OTHER): Payer: BC Managed Care – PPO | Admitting: General Surgery

## 2012-12-01 ENCOUNTER — Encounter (INDEPENDENT_AMBULATORY_CARE_PROVIDER_SITE_OTHER): Payer: Self-pay | Admitting: General Surgery

## 2012-12-01 VITALS — BP 110/82 | HR 60 | Temp 97.0°F | Resp 18 | Ht 68.0 in | Wt 145.0 lb

## 2012-12-01 DIAGNOSIS — K644 Residual hemorrhoidal skin tags: Secondary | ICD-10-CM | POA: Insufficient documentation

## 2012-12-01 MED ORDER — HYDROCORTISONE 2.5 % RE CREA: TOPICAL_CREAM | Freq: Two times a day (BID) | RECTAL | Status: DC

## 2012-12-01 NOTE — Progress Notes (Signed)
Chief Complaint  Patient presents with  . Hemorrhoids    HISTORY: Alyssa Martinez is a 37 y.o. female who presents to the office with anal pain.  Other symptoms include recurrent thrombosis and blood with wiping.  This had been occurring for several years.  She has tried metamucil, colace, sitz baths in the past with some success.  Constipation and pregnancy makes the symptoms worse.   It is intermittent in nature.  Her bowel habits are irregular and her bowel movements are sometimes hard.  Her fiber intake is through a supplement.  She has never had a colonoscopy.     Past Medical History  Diagnosis Date  . Anemia   . Hx of varicella   . Asthma   . AMA (advanced maternal age) multigravida 35+   . Hemorrhoid       Past Surgical History  Procedure Laterality Date  . Wisdom tooth extraction          Current Outpatient Prescriptions  Medication Sig Dispense Refill  . albuterol (PROVENTIL HFA;VENTOLIN HFA) 108 (90 BASE) MCG/ACT inhaler Inhale 2 puffs into the lungs every 6 (six) hours as needed for wheezing.      . docusate sodium (COLACE) 100 MG capsule Take 200-300 mg by mouth at bedtime as needed for constipation.      . lidocaine (XYLOCAINE JELLY) 2 % jelly Apply to affected area daily prn  30 mL  1  . loratadine (CLARITIN) 10 MG tablet Take 10 mg by mouth daily as needed for allergies.       . polyethylene glycol (MIRALAX / GLYCOLAX) packet as needed. Take by mouth.      . Prenatal Vit-Fe Fumarate-FA (PRENATAL MULTIVITAMIN) TABS Take 1 tablet by mouth daily at 12 noon.      . Probiotic Product (PROBIOTIC DAILY PO) Take by mouth.      . psyllium (METAMUCIL) 58.6 % packet Take 1 packet by mouth daily as needed (constipation).      . hydrocortisone (ANUSOL-HC) 2.5 % rectal cream Place rectally 2 (two) times daily. Apply around anus for irritated & painful hemorrhoids  15 g  2   No current facility-administered medications for this visit.      No Known Allergies    Family History   Problem Relation Age of Onset  . Hypertension Mother   . Thyroid disease Mother   . Cancer Mother     breast  . Urolithiasis Father   . Urolithiasis Brother   . Cancer Maternal Grandmother     breast  . Cancer Paternal Grandmother     breast  . Heart attack Paternal Grandfather     History   Social History  . Marital Status: Married    Spouse Name: N/A    Number of Children: 2  . Years of Education: N/A   Occupational History  . Homemaker    Social History Main Topics  . Smoking status: Never Smoker   . Smokeless tobacco: Never Used  . Alcohol Use: No  . Drug Use: No  . Sexual Activity: Yes    Birth Control/ Protection: None   Other Topics Concern  . None   Social History Narrative  . None      REVIEW OF SYSTEMS - PERTINENT POSITIVES ONLY: Review of Systems - General ROS: negative for - chills, fever or weight loss Hematological and Lymphatic ROS: negative for - bleeding problems, blood clots or bruising Respiratory ROS: no cough, shortness of breath, or wheezing Cardiovascular ROS: no  chest pain or dyspnea on exertion Gastrointestinal ROS: positive for - constipation and gas/bloating negative for - blood in stools, diarrhea or stool incontinence Genito-Urinary ROS: no dysuria, trouble voiding, or hematuria  EXAM: Filed Vitals:   12/01/12 0857  BP: 110/82  Pulse: 60  Temp: 97 F (36.1 C)  Resp: 18    General appearance: alert and cooperative Resp: clear to auscultation bilaterally Cardio: regular rate and rhythm GI: normal findings: soft, non-tender   Procedure: Anoscopy Surgeon: Maisie Fus Diagnosis: anal pain  Assistant: Christella Scheuermann After the risks and benefits were explained, verbal consent was obtained for above procedure  Anesthesia: none Findings: moderate pain with exam, small external skin tags, grade 1 internal hemorrhoids, 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 I think most of her symptoms will resolve with softer, more regular BM's.  I will encourage her to increase her fiber and fluid intake and use anusol cream and sitz baths for symptom management.  I will see her back in 2 months to check on her progression.     Vanita Panda, MD Colon and Rectal Surgery / General Surgery [facility]      Visit Diagnoses: No diagnosis found.  Primary Care Physician: Eartha Inch, MD

## 2012-12-01 NOTE — Patient Instructions (Signed)
Fiber Chart  You should 25-30g of fiber per day and drinking 8 glasses of water to help your bowels move regularly.  In the chart below you can look up how much fiber you are getting in an average day.  If you are not getting enough fiber, you should add a fiber supplement to your diet.  Examples of this include Metamucil, FiberCon and Citrucel.  These can be purchased at your local grocery store or pharmacy.      http://www.[employer].edu/offices/health/nutritioncoach/AtoZ/handouts/Fiber.pdf   GETTING TO GOOD BOWEL HEALTH. Irregular bowel habits such as constipation can lead to many problems over time.  Having one soft bowel movement a day is the most important way to prevent further problems.  The anorectal canal is designed to handle stretching and feces to safely manage our ability to get rid of solid waste (feces, poop, stool) out of our body.  BUT, hard constipated stools can act like ripping concrete bricks causing inflamed hemorrhoids, anal fissures, abdominal pain and bloating.     The goal: ONE SOFT BOWEL MOVEMENT A DAY!  To have soft, regular bowel movements:    Drink at least 8 tall glasses of water a day.     Take plenty of fiber.  Fiber is the undigested part of plant food that passes into the colon, acting s "natures broom" to encourage bowel motility and movement.  Fiber can absorb and hold large amounts of water. This results in a larger, bulkier stool, which is soft and easier to pass. Work gradually over several weeks up to 6 servings a day of fiber (25g a day even more if needed) in the form of: o Vegetables -- Root (potatoes, carrots, turnips), leafy green (lettuce, salad greens, celery, spinach), or cooked high residue (cabbage, broccoli, etc) o Fruit -- Fresh (unpeeled skin & pulp), Dried (prunes, apricots, cherries, etc ),  or stewed ( applesauce)  o Whole grain breads, pasta, etc (whole wheat)  o Bran cereals    Bulking Agents -- This type of water-retaining fiber generally is  easily obtained each day by one of the following:  o Psyllium bran -- The psyllium plant is remarkable because its ground seeds can retain so much water. This product is available as Metamucil, Konsyl, Effersyllium, Per Diem Fiber, or the less expensive generic preparation in drug and health food stores. Although labeled a laxative, it really is not a laxative.  o Methylcellulose -- This is another fiber derived from wood which also retains water. It is available as Citrucel. o Polyethylene Glycol - and "artificial" fiber commonly called Miralax or Glycolax.  It is helpful for people with gassy or bloated feelings with regular fiber o Flax Seed - a less gassy fiber than psyllium   No reading or other relaxing activity while on the toilet. If bowel movements take longer than 5 minutes, you are too constipated   AVOID CONSTIPATION.  High fiber and water intake usually takes care of this.  Sometimes a laxative is needed to stimulate more frequent bowel movements, but    Laxatives are not a good long-term solution as it can wear the colon out. o Osmotics (Milk of Magnesia, Fleets phosphosoda, Magnesium citrate, MiraLax, GoLytely) are safer than  o Stimulants (Senokot, Castor Oil, Dulcolax, Ex Lax)    o Do not take laxatives for more than 7days in a row.    IF SEVERELY CONSTIPATED, try a Bowel Retraining Program: o Do not use laxatives.  o Eat a diet high in roughage, such as   bran cereals and leafy vegetables.  o Drink six (6) ounces of prune or apricot juice each morning.  o Eat two (2) large servings of stewed fruit each day.  o Take one (1) heaping tablespoon of a psyllium-based bulking agent twice a day. Use sugar-free sweetener when possible to avoid excessive calories.  o Eat a normal breakfast.  o Set aside 15 minutes after breakfast to sit on the toilet, but do not strain to have a bowel movement.  o If you do not have a bowel movement by the third day, use an enema and repeat the above steps.     HEMORRHOIDS    Did you know... Hemorrhoids are one of the most common ailments known.  More than half the population will develop hemorrhoids, usually after age 30.  Millions of Americans currently suffer from hemorrhoids.  The average person suffers in silence for a long period before seeking medical care.  Today's treatment methods make some types of hemorrhoid removal much less painful.  What are hemorrhoids? Often described as "varicose veins of the anus and rectum", hemorrhoids are enlarged, bulging blood vessels in and about the anus and lower rectum. There are two types of hemorrhoids: external and internal, which refer to their location.  External (outside) hemorrhoids develop near the anus and are covered by very sensitive skin. These are usually painless. However, if a blood clot (thrombosis) develops in an external hemorrhoid, it becomes a painful, hard lump. The external hemorrhoid may bleed if it ruptures. Internal (inside) hemorrhoids develop within the anus beneath the lining. Painless bleeding and protrusion during bowel movements are the most common symptom. However, an internal hemorrhoid can cause severe pain if it is completely "prolapsed" - protrudes from the anal opening and cannot be pushed back inside.   What causes hemorrhoids? An exact cause is unknown; however, the upright posture of humans alone forces a great deal of pressure on the rectal veins, which sometimes causes them to bulge. Other contributing factors include:  . Aging  . Chronic constipation or diarrhea  . Pregnancy  . Heredity  . Straining during bowel movements  . Faulty bowel function due to overuse of laxatives or enemas . Spending long periods of time (e.g., reading) on the toilet  Whatever the cause, the tissues supporting the vessels stretch. As a result, the vessels dilate; their walls become thin and bleed. If the stretching and pressure continue, the weakened vessels protrude.  What are  the symptoms? If you notice any of the following, you could have hemorrhoids:  . Bleeding during bowel movements  . Protrusion during bowel movements . Itching in the anal area  . Pain  . Sensitive lump(s)  How are hemorrhoids treated? Mild symptoms can be relieved frequently by increasing the amount of fiber (e.g., fruits, vegetables, breads and cereals) and fluids in the diet. Eliminating excessive straining reduces the pressure on hemorrhoids and helps prevent them from protruding. A sitz bath - sitting in plain warm water for about 10 minutes - can also provide some relief . With these measures, the pain and swelling of most symptomatic hemorrhoids will decrease in two to seven days, and the firm lump should recede within four to six weeks. In cases of severe or persistent pain from a thrombosed hemorrhoid, your physician may elect to remove the hemorrhoid containing the clot with a small incision. Performed under local anesthesia as an outpatient, this procedure generally provides relief. Severe hemorrhoids may require special treatment, much of which can   be performed on an outpatient basis.  . Ligation - the rubber band treatment - works effectively on internal hemorrhoids that protrude with bowel movements. A small rubber band is placed over the hemorrhoid, cutting off its blood supply. The hemorrhoid and the band fall off in a few days and the wound usually heals in a week or two. This procedure sometimes produces mild discomfort and bleeding and may need to be repeated for a full effect.  There is a more intense version of this procedure that is done in the OR as outpatient surgery called THD.  It involves identifying blood vessels leading to the hemorrhoids and then tying them off with sutures.  This method is a little more painful than rubber band ligation but less painful than traditional hemorrhoidectomy and usually does not have to be repeated.  It is best for internal hemorrhoids that  bleed.  Rubber Band Ligation of Internal Hemorrhoids:  A.  Bulging, bleeding, internal hemorrhoid B.  Rubber band applied at the base of the hemorrhoid C.  About 7 days later, the banded hemorrhoid has fallen off leaving a small scar (arrow)  . Injection and Coagulation can also be used on bleeding hemorrhoids that do not protrude. Both methods are relatively painless and cause the hemorrhoid to shrivel up. . Hemorrhoidectomy - surgery to remove the hemorrhoids - is the most complete method for removal of internal and external hemorrhoids. It is necessary when (1) clots repeatedly form in external hemorrhoids; (2) ligation fails to treat internal hemorrhoids; (3) the protruding hemorrhoid cannot be reduced; or (4) there is persistent bleeding. A hemorrhoidectomy removes excessive tissue that causes the bleeding and protrusion. It is done under anesthesia using sutures, and may, depending upon circumstances, require hospitalization and a period of inactivity. Laser hemorrhoidectomies do not offer any advantage over standard operative techniques. They are also quite expensive, and contrary to popular belief, are no less painful.  Do hemorrhoids lead to cancer? No. There is no relationship between hemorrhoids and cancer. However, the symptoms of hemorrhoids, particularly bleeding, are similar to those of colorectal cancer and other diseases of the digestive system. Therefore, it is important that all symptoms are investigated by a physician specially trained in treating diseases of the colon and rectum and that everyone 50 years or older undergo screening tests for colorectal cancer. Do not rely on over-the-counter medications or other self-treatments. See a colorectal surgeon first so your symptoms can be properly evaluated and effective treatment prescribed.  2012 American Society of Colon & Rectal Surgeons     

## 2012-12-24 ENCOUNTER — Other Ambulatory Visit: Payer: Self-pay

## 2013-02-04 ENCOUNTER — Ambulatory Visit (INDEPENDENT_AMBULATORY_CARE_PROVIDER_SITE_OTHER): Payer: BC Managed Care – PPO | Admitting: General Surgery

## 2013-02-04 VITALS — BP 118/62 | HR 62 | Temp 98.0°F | Resp 18 | Ht 66.0 in | Wt 139.0 lb

## 2013-02-04 DIAGNOSIS — K6289 Other specified diseases of anus and rectum: Secondary | ICD-10-CM

## 2013-02-04 NOTE — Patient Instructions (Signed)
Continue the fiber supplement and at least 48 ounces of water daily.  Use Miralax as needed for constipation.  Use hemorrhoid cream or sitz baths to relieve hemorrhoid pain.

## 2013-02-04 NOTE — Progress Notes (Signed)
Alyssa Martinez is a 37 y.o. female who is here for a follow up visit regarding her constipation and anal pain.  This has gotten better since she started the fiber, but she does have occasional constipation for which she takes miralax.  She uses colace PRN as well.  She denies any bleeding and occasional pain after BM's.  She is also concerned a sore spot in her groin is a hernia.    Objective: Filed Vitals:   02/04/13 1401  BP: 118/62  Pulse: 62  Temp: 98 F (36.7 C)  Resp: 18    General appearance: alert and cooperative GI: normal findings: soft, non-tender No inguinal or femoral hernia, point tender on pubic tubercle   Anal: external skin tags, non-inflamed   Assessment and Plan: Alyssa Martinez is a 37 y.o. F with constipation and occasional anal pain.  I have asked her to continue her metamucil daily and drink plenty of water.  She will use the miralax prn.  I will see her back as needed.  We discussed hemorrhoid surgery again.  We decided to address this again once she is done having children.      Vanita Panda, MD [facility], [place] [phone]

## 2013-06-21 ENCOUNTER — Telehealth (INDEPENDENT_AMBULATORY_CARE_PROVIDER_SITE_OTHER): Payer: Self-pay

## 2013-06-21 NOTE — Telephone Encounter (Signed)
Patient is having some constipation, she is having very small liquid bms. Advised to call her PCP and increase her fluids,greenleafy vegs.,friuts. Patient verbalized understanding

## 2013-10-19 ENCOUNTER — Encounter: Payer: Self-pay | Admitting: Physician Assistant

## 2013-10-19 ENCOUNTER — Encounter: Payer: Self-pay | Admitting: Internal Medicine

## 2013-10-29 ENCOUNTER — Ambulatory Visit (INDEPENDENT_AMBULATORY_CARE_PROVIDER_SITE_OTHER): Payer: BC Managed Care – PPO | Admitting: Physician Assistant

## 2013-10-29 ENCOUNTER — Other Ambulatory Visit (INDEPENDENT_AMBULATORY_CARE_PROVIDER_SITE_OTHER): Payer: BC Managed Care – PPO

## 2013-10-29 ENCOUNTER — Encounter: Payer: Self-pay | Admitting: Physician Assistant

## 2013-10-29 VITALS — BP 112/62 | HR 60 | Ht 67.25 in | Wt 137.1 lb

## 2013-10-29 DIAGNOSIS — K5909 Other constipation: Secondary | ICD-10-CM

## 2013-10-29 DIAGNOSIS — K6289 Other specified diseases of anus and rectum: Secondary | ICD-10-CM

## 2013-10-29 DIAGNOSIS — K5900 Constipation, unspecified: Secondary | ICD-10-CM

## 2013-10-29 DIAGNOSIS — K644 Residual hemorrhoidal skin tags: Secondary | ICD-10-CM

## 2013-10-29 LAB — CBC WITH DIFFERENTIAL/PLATELET
BASOS PCT: 0.9 % (ref 0.0–3.0)
Basophils Absolute: 0.1 10*3/uL (ref 0.0–0.1)
EOS PCT: 2 % (ref 0.0–5.0)
Eosinophils Absolute: 0.1 10*3/uL (ref 0.0–0.7)
HCT: 41.7 % (ref 36.0–46.0)
Hemoglobin: 14 g/dL (ref 12.0–15.0)
LYMPHS PCT: 29.3 % (ref 12.0–46.0)
Lymphs Abs: 1.8 10*3/uL (ref 0.7–4.0)
MCHC: 33.6 g/dL (ref 30.0–36.0)
MCV: 93 fl (ref 78.0–100.0)
Monocytes Absolute: 0.3 10*3/uL (ref 0.1–1.0)
Monocytes Relative: 5.7 % (ref 3.0–12.0)
Neutro Abs: 3.8 10*3/uL (ref 1.4–7.7)
Neutrophils Relative %: 62.1 % (ref 43.0–77.0)
PLATELETS: 267 10*3/uL (ref 150.0–400.0)
RBC: 4.48 Mil/uL (ref 3.87–5.11)
RDW: 12.6 % (ref 11.5–15.5)
WBC: 6.1 10*3/uL (ref 4.0–10.5)

## 2013-10-29 LAB — HIGH SENSITIVITY CRP: CRP HIGH SENSITIVITY: 0.26 mg/L (ref 0.000–5.000)

## 2013-10-29 LAB — TSH: TSH: 1.36 u[IU]/mL (ref 0.35–4.50)

## 2013-10-29 MED ORDER — LINACLOTIDE 290 MCG PO CAPS: 290.0000 ug | ORAL_CAPSULE | Freq: Every day | ORAL | Status: DC

## 2013-10-29 MED ORDER — HYDROCORTISONE ACE-PRAMOXINE 2.5-1 % RE CREA: 1.0000 "application " | TOPICAL_CREAM | Freq: Three times a day (TID) | RECTAL | Status: DC

## 2013-10-29 NOTE — Progress Notes (Addendum)
Subjective:    Patient ID: Alyssa Martinez, female    DOB: 1975-03-28, 38 y.o.   MRN: [id number]  HPI  Alyssa" Morrie Martinez" is a pleasant 38 year old white female referred today by her PCP for evaluation of chronic constipation and hemorrhoids.. Patient says that she has had some difficulty with constipation for many many years and had more difficulty during her first pregnancy 8 few years back but her bowel habits returned to normal after she delivered. She had a second pregnancy and delivery [place] last summer and says that she had major problems with constipation during the pregnancy and worse problems ever since. Over the past 3 months she's been having extreme difficulty despite taking multiple laxatives. She says she may go for 5 days between bowel movements and gets very full and uncomfortable. Straining etc. also aggravates her hemorrhoids. She says she had an episode of thrombosed hemorrhoids during her pregnancy and had been seen by the surgeons. She was most recently evaluated by Dr. Romie Levee last fall and her note states that on anoscopy she had grade 1 internal hemorrhoids and small external hemorrhoids.. Patient has tried purging her bowel  A couple of times within the past 2 months with mag citrate. She says the first time this worked with second time she actually took 2 bottles and still didn't work for her well. She's been taking MiraLax on a daily basis had been taking Metamucil and Colace daily but now over the past 2 weeks has been on Amitiza 8 mcg twice daily. She says she's having a bowel movement every few days but just small amounts and still feels constipated and uncomfortable. Her hemorrhoids have been flared up a week or so ago. She has been icing hemorrhoids and periodically using a cream given to her by Dr. Maisie Fus.As she starts talking about her hemorrhoids ,she becomes tearful and says that the hemorrhoids are significantly affecting her life. Her husband states that sometimes she  complains of pain just from standing cooking dinner or when  she plays outside with her children this tends to flareup hemorrhoids. Says she gets a lot of throbbing and discomfort and also has a lot of tissue which makes it difficult to clean herself. On further questioning she has not been using any lidocaine cream though she had been given some and has not been using the Analpram on regular basis. She also says she's had some episodes of left upper quadrant  pain which feels like a "charley horse" sometimes these episodes are followed by diarrhea but these have been very sporadic.    Review of Systems  Constitutional: Negative.   HENT: Negative.   Eyes: Negative.   Respiratory: Negative.   Cardiovascular: Negative.   Gastrointestinal: Positive for abdominal pain, constipation and rectal pain.  Endocrine: Negative.   Genitourinary: Negative.   Musculoskeletal: Negative.   Allergic/Immunologic: Negative.   Neurological: Negative.   Hematological: Negative.   Psychiatric/Behavioral: Negative.    Outpatient Prescriptions Prior to Visit  Medication Sig Dispense Refill  . albuterol (PROVENTIL HFA;VENTOLIN HFA) 108 (90 BASE) MCG/ACT inhaler Inhale 2 puffs into the lungs every 6 (six) hours as needed for wheezing.      . docusate sodium (COLACE) 100 MG capsule Take 200-300 mg by mouth at bedtime as needed for constipation.      . hydrocortisone (ANUSOL-HC) 2.5 % rectal cream Place rectally 2 (two) times daily. Apply around anus for irritated & painful hemorrhoids  15 g  2  . loratadine (CLARITIN) 10 MG  tablet Take 10 mg by mouth daily as needed for allergies.       . polyethylene glycol (MIRALAX / GLYCOLAX) packet as needed. Take by mouth.      . Probiotic Product (PROBIOTIC DAILY PO) Take by mouth. [employer]      . psyllium (METAMUCIL) 58.6 % packet Take 1 packet by mouth daily as needed (constipation).      . Prenatal Vit-Fe Fumarate-FA (PRENATAL MULTIVITAMIN) TABS Take 1 tablet by mouth  daily at 12 noon.       No facility-administered medications prior to visit.   No Known Allergies Patient Active Problem List   Diagnosis Date Noted  . Chronic constipation 10/29/2013  . External hemorrhoids 12/01/2012  . Dyspnea 10/02/2012  . Sinus bradycardia 10/02/2012  . Thrombosed external hemorrhoids 06/09/2012   History  Substance Use Topics  . Smoking status: Former Smoker    Types: Cigarettes    Quit date: 02/19/2000  . Smokeless tobacco: Never Used  . Alcohol Use: Yes     Comment: Occassionally   family history includes Breast cancer in her maternal grandmother, mother, and paternal grandmother; Heart Problems in her paternal grandfather; Hypertension in her mother; Thyroid disease in her mother; Urolithiasis in her brother and father.     Objective:   Physical Exam  well-developed young white female in no acute distress, accompanied by her husband, pleasant blood pressure 112/62 pulse 60 height 5 foot 7 weight 137. HEENT; nontraumatic, normocephalic, EOMI PERRLA sclera anicteric, Supple ;no JVD, Cardio vascular; regular rate and rhythm with S1-S2 no murmur or gallop, Pulmonary ;clear bilaterally, Abdomen; soft ,nondistended she has some mild rather generalized tenderness perhaps more so in the right lower quadrant there is no palpable mass or hepatosplenomegaly bowel sounds are present, Rectal; exam small external hemorrhoids which are edematous but not thrombosed she is exquisitely tender on rectal exam and sphincter is tight no internal lesion felt stool heme-negative, Ext;  no clubbing cyanosis or edema skin warm dry, Psych; mood and affect appropriate        Assessment & Plan:  #85 38 year old female with chronic refractory constipation- probably functional with IBS-worse over the past 3 months #2 persistently symptomatic external hemorrhoids  Plan; patient will be given a bowel prep today to purge her bowel/Suprep After she purge the bowel she is asked to continue  on MiraLax 17 g in 8 ounces of water daily continue liberal water intake and start a trial of,Linzess 290 mcg once daily, and stop Amitiza For hemorrhoid management she will be referred back to Dr. Romie Levee for consideration of hemorrhoidectomy as she is ready to have something definitive done. Discussed general rectal care i.e. use of baby wipes warm tub soaks etc. for hemorrhoid flareups. She is also asked to use the Analpram 2.5% alternating with lidocaine 5% ointment at least 3-4 times daily when her hemorrhoids are symptomatic. Patient will followup with Dr. Rhea Belton in approximately one month. If symptoms are persisting may need to consider colonoscopy.  Addendum: Reviewed and agree with initial management. Beverley Fiedler, MD

## 2013-10-29 NOTE — Patient Instructions (Addendum)
We sent a prescription to [facility] and [address] for Analpram cream. Use this 3-4 times daily for hemorrhoid pain and irritation.  Also use the Recticare samples we have given you.  You can alternate the two.  You can get Recticare at [facility] also. We sent a prescription for Linzess 290 mcg. Take 1 tab daily for constipation. We have given you a prep to help purge your bowels.    You will get a call from [facility], Dr. Lendon Ka, regarding an appointment.

## 2013-11-01 ENCOUNTER — Telehealth: Payer: Self-pay | Admitting: Physician Assistant

## 2013-11-01 NOTE — Telephone Encounter (Signed)
Patient calling to report the bowel purge went well. She lost 4 lbs. She took the Linzess last night at 9 PM. She had stomach cramping and diarrhea for 2 hours. She was up at 1:30 AM with diarrhea again. States she does not want to take Linzess. Talked with patient about taking Linzess in AM 30-45 before she eats. She states she does not want to take it because she has children to get ready for school and if it gives her diarrhea she cannot function. Please, advise.

## 2013-11-02 ENCOUNTER — Telehealth: Payer: Self-pay | Admitting: *Deleted

## 2013-11-02 MED ORDER — LINACLOTIDE 145 MCG PO CAPS: ORAL_CAPSULE | ORAL | Status: DC

## 2013-11-02 NOTE — Telephone Encounter (Signed)
Patient notified

## 2013-11-02 NOTE — Telephone Encounter (Signed)
Patient has been scheduled with Dr. Lona Millard on 11-15-2013 at 10:30am.  Patient is aware of the appointment.

## 2013-11-02 NOTE — Telephone Encounter (Signed)
Concerned she has not heard back from [place] since yesterday. I assured her she would get a call sometime today.

## 2013-11-02 NOTE — Telephone Encounter (Signed)
Spoke with patient and she is asking if she may try Linzess 145 mcg in AM. Spoke with Mike Gip, PA and this is ok to try. New rx sent to pharmacy. Left a message for patient to call back.

## 2013-11-02 NOTE — Telephone Encounter (Signed)
Ok to stop Linzess- she can go back to [employer] * mcg twice daily- or if wants to try 24 mcg dose BID  Can send rx for that or give her samples to try first- then ask her to let us know if this works

## 2013-12-16 ENCOUNTER — Ambulatory Visit: Payer: BC Managed Care – PPO | Admitting: Internal Medicine

## 2013-12-20 ENCOUNTER — Encounter: Payer: Self-pay | Admitting: Physician Assistant

## 2013-12-31 ENCOUNTER — Other Ambulatory Visit: Payer: Self-pay | Admitting: Obstetrics and Gynecology

## 2013-12-31 DIAGNOSIS — N6489 Other specified disorders of breast: Secondary | ICD-10-CM

## 2014-01-10 ENCOUNTER — Ambulatory Visit: Payer: BC Managed Care – PPO | Admitting: Internal Medicine

## 2014-01-12 ENCOUNTER — Ambulatory Visit
Admission: RE | Admit: 2014-01-12 | Discharge: 2014-01-12 | Disposition: A | Discharge status: Home / Self Care | Payer: BC Managed Care – PPO | Source: Ambulatory Visit | Attending: Obstetrics and Gynecology | Admitting: Obstetrics and Gynecology

## 2014-01-12 DIAGNOSIS — N6489 Other specified disorders of breast: Secondary | ICD-10-CM

## 2014-02-08 ENCOUNTER — Other Ambulatory Visit (INDEPENDENT_AMBULATORY_CARE_PROVIDER_SITE_OTHER): Payer: BC Managed Care – PPO

## 2014-02-08 ENCOUNTER — Ambulatory Visit (INDEPENDENT_AMBULATORY_CARE_PROVIDER_SITE_OTHER): Payer: BC Managed Care – PPO | Admitting: Internal Medicine

## 2014-02-08 ENCOUNTER — Encounter: Payer: Self-pay | Admitting: Internal Medicine

## 2014-02-08 VITALS — BP 110/72 | HR 64 | Ht 67.25 in | Wt 137.5 lb

## 2014-02-08 DIAGNOSIS — R109 Unspecified abdominal pain: Secondary | ICD-10-CM

## 2014-02-08 DIAGNOSIS — R1013 Epigastric pain: Secondary | ICD-10-CM

## 2014-02-08 DIAGNOSIS — K5909 Other constipation: Secondary | ICD-10-CM

## 2014-02-08 DIAGNOSIS — K5900 Constipation, unspecified: Secondary | ICD-10-CM

## 2014-02-08 DIAGNOSIS — R140 Abdominal distension (gaseous): Secondary | ICD-10-CM

## 2014-02-08 LAB — CBC WITH DIFFERENTIAL/PLATELET
BASOS ABS: 0 10*3/uL (ref 0.0–0.1)
Basophils Relative: 0.7 % (ref 0.0–3.0)
EOS ABS: 0.2 10*3/uL (ref 0.0–0.7)
EOS PCT: 2.5 % (ref 0.0–5.0)
HEMATOCRIT: 40.5 % (ref 36.0–46.0)
Hemoglobin: 13.3 g/dL (ref 12.0–15.0)
Lymphocytes Relative: 27 % (ref 12.0–46.0)
Lymphs Abs: 1.9 10*3/uL (ref 0.7–4.0)
MCHC: 32.8 g/dL (ref 30.0–36.0)
MCV: 93 fl (ref 78.0–100.0)
Monocytes Absolute: 0.4 10*3/uL (ref 0.1–1.0)
Monocytes Relative: 5.9 % (ref 3.0–12.0)
NEUTROS PCT: 63.9 % (ref 43.0–77.0)
Neutro Abs: 4.5 10*3/uL (ref 1.4–7.7)
Platelets: 262 10*3/uL (ref 150.0–400.0)
RBC: 4.36 Mil/uL (ref 3.87–5.11)
RDW: 12.9 % (ref 11.5–15.5)
WBC: 7 10*3/uL (ref 4.0–10.5)

## 2014-02-08 LAB — H. PYLORI ANTIBODY, IGG: H PYLORI IGG: POSITIVE — AB

## 2014-02-08 LAB — IGA: IGA: 214 mg/dL (ref 68–378)

## 2014-02-08 MED ORDER — LUBIPROSTONE 24 MCG PO CAPS: 24.0000 ug | ORAL_CAPSULE | Freq: Two times a day (BID) | ORAL | Status: DC

## 2014-02-08 NOTE — Progress Notes (Signed)
Subjective:    Patient ID: Alyssa Martinez, female    DOB: April 10, 1975, 38 y.o.   MRN: [id number]  HPI  Alyssa Martinez is a 38 yo female with PMH of chronic constipation and hemorrhoids who is seen in followup.  She was initially seen by Mike GipAmy Esterwood, PA-C on 10/29/2013 in consultation at the request of Dani GobbleSara Carter Spencer, PA-C.  She has been dealing with constipation over the last 6-7 months. She has tried multiple laxatives including Colace, MiraLAX, Metamucil. She has tried eating prunes which helped for a few weeks but stopped working. Peppermint tea also seem to help but also lost efficacy. He tried lubiprostone 8 g twice daily with no benefit. When she was seen here she was started on Linzess 290 g daily and this worked but gave her diarrhea for 2-4 hours after each dose. Her primary care provider decrease this to 145 g daily but it also caused diarrhea for 2-4 hours. She has been using this intermittently with good effect but quite limiting diarrhea after each dose.  She reports constipation leading to abdominal bloating uncomfortableness and fullness. This also was associated with gas. After having a bowel movement with Linzess symptoms are dramatically improved but then build over days. No blood in her stool or melena. She was seen by Romie LeveeAlicia Thomas, M.D. and had anoscopy revealing internal and external hemorrhoid. No surgical intervention was recommended due to her ongoing constipation and erratic bowel movements. She is not having blood in her stool or melena. She is having occasional burning discomfort in the epigastrium most noticeable in the evening when she lies down. Also occasional heartburn for which she uses Zantac 150 mg. Zantac works well when she takes it but she is using it as needed. Appetite is good without nausea or vomiting. No early satiety. No weight loss though weight fluctuates 5-7 pounds with weight being higher before bowel movement. She has noticed occasional sharp  pain in the epigastrium just to the left of center near the rib cage. She notices this when she bends over, picks up a child, etc. It is intermittent and last about 10 seconds.  No family history of colorectal cancer or other GI tract malignancy. No family history of IBD.  She is a stay-at-home parent of 2 children ages 684 and 1. Her husband works for the [employer] with football.  Review of Systems As per history of present illness, otherwise negative  Current Medications, Allergies, Past Medical History, Past Surgical History, Family History and Social History were reviewed in [facility] electronic medical record.     Objective:   Physical Exam BP 110/72 mmHg  Pulse 64  Ht 5' 7.25" (1.708 m)  Wt 137 lb 8 oz (62.37 kg)  BMI 21.38 kg/m2  LMP 01/15/2014 Constitutional: Well-developed and well-nourished. No distress. HEENT: Normocephalic and atraumatic. Oropharynx is clear and moist. No oropharyngeal exudate. Conjunctivae are normal.  No scleral icterus. Neck: Neck supple. Trachea midline. Cardiovascular: Normal rate, regular rhythm and intact distal pulses. No M/R/G Pulmonary/chest: Effort normal and breath sounds normal. No wheezing, rales or rhonchi. Abdominal: Soft, nontender, nondistended. Bowel sounds active throughout. There are no masses palpable. No hepatosplenomegaly. Extremities: no clubbing, cyanosis, or edema Lymphadenopathy: No cervical adenopathy noted. Neurological: Alert and oriented to person place and time. Skin: Skin is warm and dry. No rashes noted. Psychiatric: Normal mood and affect. Behavior is normal.  CBC    Component Value Date/Time   WBC 6.1 10/29/2013 1150   RBC 4.48 10/29/2013 1150  HGB 14.0 10/29/2013 1150   HCT 41.7 10/29/2013 1150   PLT 267.0 10/29/2013 1150   MCV 93.0 10/29/2013 1150   MCH 31.5 10/01/2012 2044   MCHC 33.6 10/29/2013 1150   RDW 12.6 10/29/2013 1150   LYMPHSABS 1.8 10/29/2013 1150   MONOABS 0.3 10/29/2013 1150   EOSABS 0.1  10/29/2013 1150   BASOSABS 0.1 10/29/2013 1150   Lab Results  Component Value Date   TSH 1.36 10/29/2013   CT abdomen and pelvis -- 2014 -- reviewed no GI pathology    Assessment & Plan:  38 yo female with PMH of chronic constipation and hemorrhoids who is seen in followup.   1. Chronic constipation with abdominal bloating and gas -- she is feeling much better after using Linzess but is having limiting diarrhea. We have discussed that this is not how Linzess is intended to work. I would like her to have a medication that she can reliably use that will induce regular formed incomplete bowel movements. Will try lubiprostone 24 g twice daily. We have discussed how there are no lower GI alarm symptoms to warrant colonoscopy at this time. She understands this recommendation and agrees. If symptoms persist or change colonoscopy will likely be pursued. Previous CT scan of the abdomen and pelvis from 2014 reviewed and unremarkable. (Of note for future medication options Linzess resulted in diarrhea at both doses, MiraLAX and fiber supplement caused bloating and further abdominal discomfort, Colace no real benefit by itself)  2. Epigastric discomfort -- noticeable at night. Possible gastritis. She does use ibuprofen for neck and back pain. She's been using this drug more recently lately. Will give her Zantac 150 mg twice daily on a scheduled basis for 2-4 weeks. Abdominal ultrasound. If symptoms persist will consider endoscopy.  Labs today to include celiac panel and H. pylori antibody.  Follow-up in 4-6 weeks, sooner if necessary Over 40 minutes spent with the patient today of which at least 50% spent on counseling and discussing the above

## 2014-02-08 NOTE — Patient Instructions (Addendum)
You have been scheduled for an abdominal ultrasound at [facility] (1st floor of hospital) on 02/09/2014 at 10am. Please arrive 15 minutes prior to your appointment for registration. Make certain not to have anything to eat or drink 6 hours prior to your appointment. Should you need to reschedule your appointment, please contact radiology at [phone]. This test typically takes about 30 minutes to perform.  Discontinue Linzess  Go to the basement for labs today Your follow up appointment with Dr Rhea BeltonPyrtle is on 03/30/2014 at 8:45am

## 2014-02-09 ENCOUNTER — Ambulatory Visit (HOSPITAL_COMMUNITY)
Admission: RE | Admit: 2014-02-09 | Discharge: 2014-02-09 | Disposition: A | Discharge status: Home / Self Care | Payer: BC Managed Care – PPO | Source: Ambulatory Visit | Attending: Internal Medicine | Admitting: Internal Medicine

## 2014-02-09 ENCOUNTER — Other Ambulatory Visit: Payer: Self-pay

## 2014-02-09 DIAGNOSIS — R1013 Epigastric pain: Secondary | ICD-10-CM | POA: Diagnosis present

## 2014-02-09 DIAGNOSIS — K5909 Other constipation: Secondary | ICD-10-CM

## 2014-02-09 LAB — T-TRANSGLUTAMINASE (TTG) IGG: Tissue Transglut Ab: <2 U/mL (ref 0–5)

## 2014-02-09 MED ORDER — AMOXICILL-CLARITHRO-LANSOPRAZ PO MISC: Freq: Two times a day (BID) | ORAL | Status: DC

## 2014-02-10 LAB — TISSUE TRANSGLUTAMINASE, IGA: Tissue Transglutaminase Ab, IgA: 1 U/mL (ref <4)

## 2014-02-15 ENCOUNTER — Telehealth: Payer: Self-pay | Admitting: Internal Medicine

## 2014-02-15 NOTE — Telephone Encounter (Signed)
Spoke with the patient. She has not vomited but has had waves of nausea after taking the medications in the morning. Discussed taking the Prevacid alone on an empty stomach at least 30 minutes before breakfast. Then after eating, she will take the ATB's. She reports she is not having any problem that she noticed with the evening doses. But she will be certain to take the evening ATB's after her meal. She may continue her OTC probiotic if she wishes. She will discuss with the pediatrician any concerns about testing her children for H-pylori. She is to call back with further questions or concerns.

## 2014-02-16 ENCOUNTER — Other Ambulatory Visit: Payer: Self-pay

## 2014-02-16 MED ORDER — ONDANSETRON HCL 4 MG PO TABS: ORAL_TABLET | ORAL | Status: DC

## 2014-02-16 NOTE — Telephone Encounter (Signed)
Yes Zofran is fine- 4mg  q 6 hours prn nausea #30/ 0

## 2014-02-16 NOTE — Telephone Encounter (Signed)
Patient calls reporting she has followed the recommendations. She continues to have nausea though she feels it is a little improved. Can she have Zofran to try for the duration of the Prevpak?

## 2014-02-17 NOTE — Telephone Encounter (Signed)
Patient aware. Rx escribed.

## 2014-03-18 ENCOUNTER — Encounter: Payer: Self-pay | Admitting: *Deleted

## 2014-03-30 ENCOUNTER — Ambulatory Visit: Payer: BC Managed Care – PPO | Admitting: Internal Medicine

## 2014-04-06 ENCOUNTER — Ambulatory Visit (INDEPENDENT_AMBULATORY_CARE_PROVIDER_SITE_OTHER): Payer: BLUE CROSS/BLUE SHIELD | Admitting: Internal Medicine

## 2014-04-06 ENCOUNTER — Encounter: Payer: Self-pay | Admitting: Internal Medicine

## 2014-04-06 VITALS — BP 110/72 | HR 72 | Resp 14 | Ht 67.5 in | Wt 136.2 lb

## 2014-04-06 DIAGNOSIS — Z8619 Personal history of other infectious and parasitic diseases: Secondary | ICD-10-CM

## 2014-04-06 DIAGNOSIS — K589 Irritable bowel syndrome without diarrhea: Secondary | ICD-10-CM

## 2014-04-06 DIAGNOSIS — K5900 Constipation, unspecified: Secondary | ICD-10-CM

## 2014-04-06 MED ORDER — LINACLOTIDE 145 MCG PO CAPS: ORAL_CAPSULE | ORAL | Status: DC

## 2014-04-06 NOTE — Progress Notes (Signed)
Subjective:    Patient ID: Alyssa Martinez, female    DOB: 09/26/1975, 39 y.o.   MRN: [id number]  HPI Alyssa Martinez is a 39 yo female with PMH of chronic constipation, H. pylori antibody positive status post treatment who is seen in follow-up. She was initially seen by me on 02/08/2014 to evaluate constipation. At that time she was also having abdominal bloating and fullness. She has noticed epigastric pain on occasion. Ultrasound of the abdomen was performed which was unremarkable except for small nonobstructing kidney stones. The gallbladder looked normal. H. pylori antibody was positive and she was treated with triple therapy which she completed around 02/18/2014. After her last visit she was started on lubiprostone 24 g twice daily because Linzess it caused diarrhea.  She reports the lubiprostone works very well initially as she was having 1 bowel movement a day but it slowly lost efficacy. She noticed she was going 3-4 days 20 bowel movements feeling heavy and bloated and felt the need to take magnesium citrate. This worked well. About 10 days ago she developed a stomach "bug" with diarrhea and associated cramping. Her daughter also had similar symptoms. The diarrhea resolved but she has not restarted lubiprostone. When she becomes constipated or misses bowel movements she feels abdominal bloating and heaviness. Occasionally she will feel mild early satiety. Appetite has remained good. No significant nausea or vomiting. Denies heartburn. She is no longer taking H2 blocker. Weight has fluctuated a bit and at times she's noticed herself 3-5 pounds heavier which she finds difficult to understand.  Again no family history of colorectal cancer or GI tract malignancy. No family history of IBD  Review of Systems As per history of present illness, otherwise negative  Current Medications, Allergies, Past Medical History, Past Surgical History, Family History and Social History were reviewed in  [facility] electronic medical record.     Objective:   Physical Exam BP 110/72 mmHg  Pulse 72  Resp 14  Ht 5' 7.5" (1.715 m)  Wt 136 lb 3.2 oz (61.78 kg)  BMI 21.00 kg/m2  LMP  (LMP Unknown) Constitutional: Well-developed and well-nourished. No distress. HEENT: Normocephalic and atraumatic. Oropharynx is clear and moist. No oropharyngeal exudate. Conjunctivae are normal.  No scleral icterus. Neck: Neck supple. Trachea midline. Cardiovascular: Normal rate, regular rhythm and intact distal pulses. No M/R/G Pulmonary/chest: Effort normal and breath sounds normal. No wheezing, rales or rhonchi. Abdominal: Soft, mild diffuse tenderness without rebound or guarding, nondistended. Bowel sounds active throughout. There are no masses palpable. No hepatosplenomegaly. Extremities: no clubbing, cyanosis, or edema Lymphadenopathy: No cervical adenopathy noted. Neurological: Alert and oriented to person place and time. Skin: Skin is warm and dry. No rashes noted. Psychiatric: Normal mood and affect. Behavior is normal.  Celiac panel neg Lab Results  Component Value Date   TSH 1.36 10/29/2013   CBC    Component Value Date/Time   WBC 7.0 02/08/2014 1032   RBC 4.36 02/08/2014 1032   HGB 13.3 02/08/2014 1032   HCT 40.5 02/08/2014 1032   PLT 262.0 02/08/2014 1032   MCV 93.0 02/08/2014 1032   MCH 31.5 10/01/2012 2044   MCHC 32.8 02/08/2014 1032   RDW 12.9 02/08/2014 1032   LYMPHSABS 1.9 02/08/2014 1032   MONOABS 0.4 02/08/2014 1032   EOSABS 0.2 02/08/2014 1032   BASOSABS 0.0 02/08/2014 1032   CT scan abd/pelvis - 2014 - no GI pathology  Abd US - reviewed today     Assessment & Plan:  39 yo  female with PMH of chronic constipation, H. pylori antibody positive status post treatment who is seen in follow-up.   1. IBS with constipation, abdominal bloating -- her symptoms remain most consistent with irritable bowel with constipation predominance. Linzess helped more with constipation  but did result in diarrhea at both the 145 and 290 g dose. However when she was taking this medication the bloating resolved entirely. I recommended we try Linzess at reduced dose. The capsule can be opened and sprinkled onto applesauce or yogurt. Will plan 72.5 g daily for constipation. Discontinue lubiprostone altogether. I asked that she call me in 2-3 weeks to let me know if this is helping. Other options include adding Benefiber and trial of a gluten-free diet. No alarm symptoms to warrant colonoscopy at this time  2. H. pylori antibody positive -- treated with Prevpac. I recommended H. pylori breath test to confirm eradication of infection after completing antibiotics. This test will be scheduled  Return in 8-12 weeks, sooner if necessary

## 2014-04-06 NOTE — Patient Instructions (Signed)
Please discontinue Amitiza.  Please purchase the following medications over the counter and take as directed: Linzess 145 mcg-Open 1 capsule and empty 1/2 of the contents into applesauce or yogurt daily.  Please call us in 2 weeks with an update on your condition. Our phone number is [phone].  You have been scheduled for an H Pylori breath test at [facility] on 05/05/14 @ 7:15 am. Please follow written instructions given to you today.  You have been scheduled for a follow up with Dr Rhea BeltonPyrtle on Tuesday, 05/31/14 @ 2:00 pm.  CC:Dr Antony HasteMichael Badger

## 2014-05-02 ENCOUNTER — Telehealth: Payer: Self-pay | Admitting: Internal Medicine

## 2014-05-02 NOTE — Telephone Encounter (Signed)
Is she happy with this response, meaning improved bloating? Is the diarrhea with the lower dose something she could live with, or not?  If not, this is certainly okay, we need to find another option

## 2014-05-02 NOTE — Telephone Encounter (Signed)
Pt called back to give an update on how she is doing with the linzess. States she was told to try taking half of the lower dose linzess. States the half dose does keep the bloating and full feeling down but she has continued to have diarrhea. She states she even tried to take a third of the linzess and it gave her diarrhea also. The linzess does help her weight remain stable. Dr. Rhea BeltonPyrtle notified.

## 2014-05-03 NOTE — Telephone Encounter (Signed)
Pt states the bloating is 90% better. States that 4 out of 7 days a week she is good but it presents a problem the other days when she has to run to the bathroom several times in a row. Pt also states it is difficult when she goes out of town. Pt would be open to other options if available. Please advise.

## 2014-05-05 ENCOUNTER — Ambulatory Visit (HOSPITAL_COMMUNITY)
Admission: RE | Admit: 2014-05-05 | Discharge: 2014-05-05 | Disposition: A | Discharge status: Home / Self Care | Payer: BLUE CROSS/BLUE SHIELD | Source: Ambulatory Visit | Attending: Internal Medicine | Admitting: Internal Medicine

## 2014-05-05 ENCOUNTER — Encounter (HOSPITAL_COMMUNITY)
Admission: RE | Disposition: A | Discharge status: Home / Self Care | Payer: Self-pay | Source: Ambulatory Visit | Attending: Internal Medicine

## 2014-05-05 DIAGNOSIS — Z8719 Personal history of other diseases of the digestive system: Secondary | ICD-10-CM | POA: Diagnosis present

## 2014-05-05 HISTORY — PX: BREATH TEK H PYLORI: SHX5422

## 2014-05-05 SURGERY — BREATH TEST, FOR HELICOBACTER PYLORI

## 2014-05-05 NOTE — Progress Notes (Signed)
   05/05/14 1011  BREATH TEK ASSESSMENT  Referring MD Dr Erick BlinksJay Pyrtle  Time of Last PO Intake 0800  Baseline Breath At: 0721  Pranactin Given At: 0721  Post-Dose Breath At: 0736  Sample 1 3.4%  Sample 2 2.4%  Test Negative

## 2014-05-05 NOTE — Telephone Encounter (Signed)
No other great options as she has tried lubiprostone and Linzess. Would recommend she continue with the half dose of the 145 g capsule daily. On days when she has activities she can skip the dose on that day. Another option would be attempting to take the medication with dinner rather than breakfast Have her let us know how she is doing as she progresses

## 2014-05-05 NOTE — Telephone Encounter (Signed)
Left message for pt to call back  °

## 2014-05-06 ENCOUNTER — Encounter (HOSPITAL_COMMUNITY): Payer: Self-pay | Admitting: Internal Medicine

## 2014-05-06 NOTE — Telephone Encounter (Signed)
Spoke with pt and she is aware. Also discussed H Pylori results with pt and she is aware.

## 2014-05-31 ENCOUNTER — Encounter: Payer: Self-pay | Admitting: Internal Medicine

## 2014-05-31 ENCOUNTER — Ambulatory Visit (INDEPENDENT_AMBULATORY_CARE_PROVIDER_SITE_OTHER): Payer: BLUE CROSS/BLUE SHIELD | Admitting: Internal Medicine

## 2014-05-31 VITALS — BP 104/62 | HR 80 | Ht 67.25 in | Wt 141.2 lb

## 2014-05-31 DIAGNOSIS — K589 Irritable bowel syndrome without diarrhea: Secondary | ICD-10-CM | POA: Diagnosis not present

## 2014-05-31 DIAGNOSIS — K5900 Constipation, unspecified: Secondary | ICD-10-CM

## 2014-05-31 DIAGNOSIS — R140 Abdominal distension (gaseous): Secondary | ICD-10-CM | POA: Diagnosis not present

## 2014-05-31 MED ORDER — LINACLOTIDE 145 MCG PO CAPS: ORAL_CAPSULE | ORAL | Status: DC

## 2014-05-31 NOTE — Progress Notes (Signed)
Subjective:    Patient ID: Alyssa Martinez, female    DOB: 1975/05/12, 39 y.o.   MRN: [id number]  HPI Alyssa Martinez is a 39 year old female with a past medical history of chronic constipation, H. pylori status post treatment who is seen in follow-up. She was last seen on 04/16/2014. At that time she was having trouble with constipation as well as abdominal bloating. The decision was made to try Linzess at a lower dose because she was having some diarrhea associated with this medication. She was using 72.5 g. Previous he lubiprostone had not been beneficial nor had MiraLAX and Colace. Since last visit she had an H. pylori breath test which was negative indicating eradication after therapy.  So she reports again feeling bloated and constipated. She was using 1/3-1/2 Linzess tablet, 145 g dose. But when effective it almost always causes diarrhea. This will be to to 6 urgent bowel movements in a 2 hour period after which she feels dramatically better and less bloated. She has found that if she takes this medication and eats quickly she develops the loose stools over the 2 hour period. If she waits 30 minutes to an hour after taking the medication to have breakfast she tends to not have diarrhea but also then may feel bloated and incompletely evacuated. She's had no new symptoms including no rectal bleeding or melena. Menstrual cycles have been regular. She did resume prenatal vitamins though she reports there is been no decision on whether they will try for a third child. Good appetite, no nausea or vomiting. 5 pound weight gain which concerns her.   Review of Systems as per history of present illness, otherwise negative  Current Medications, Allergies, Past Medical History, Past Surgical History, Family History and Social History were reviewed in [employer] record.      Objective:   Physical Exam BP 104/62 mmHg  Pulse 80  Ht 5' 7.25" (1.708 m)  Wt 141 lb 4 oz (64.071  kg)  BMI 21.96 kg/m2  LMP 05/07/2014  Breastfeeding? No Constitutional: Well-developed and well-nourished. No distress. HEENT: Normocephalic and atraumatic. Marland Kitchen Conjunctivae are normal.  No scleral icterus. Neck: Neck supple. Trachea midline. Cardiovascular: Normal rate, regular rhythm and intact distal pulses. No M/R/G Pulmonary/chest: Effort normal and breath sounds normal. No wheezing, rales or rhonchi. Abdominal: Soft, nontender, nondistended. Bowel sounds active throughout. There are no masses palpable. No hepatosplenomegaly. Extremities: no clubbing, cyanosis, or edema Neurological: Alert and oriented to person place and time. Psychiatric: Normal mood and affect. Behavior is normal.   H. pylori breath test  -- negative   CT scan 2014 no GI pathology   ultrasound abdomen no GI pathology Celiac panel negative     Assessment & Plan:  39 year old female with a past medical history of chronic constipation, H. pylori status post treatment who is seen in follow-up.  1. IBS with constipation predominance abdominal bloating -- she remains convinced that all of her IBS symptoms started after her second child was born. She's not had a perfect response to Linzess. If she takes it too close to breakfast she has loose stools which urgent over 2 hour period but results in feeling of complete evacuation and less bloating. That being said this can be restricted because she has to be near a bathroom over that. If she moves the dose to 30 minutes before breakfast it seems less effective. No alarm symptoms. We discussed ongoing trial and error as this appears to be IBS without easy treatment --  Begin VSL#3 2 caps daily x 30 days.  Call in 30 days to see if this has been beneficial.  consistency with daily dosing is important  --Continue Linzess 75 g daily 30 minutes before breakfast  --Low bloating diet   2. H. pylori -- breath test negative now after treatment   Call in one month to let me know how  she is feeling, return in 3-4 months.  25 min spent with pt today

## 2014-05-31 NOTE — Patient Instructions (Signed)
Please continue Linzess 1/2 capsule daily.  Please take your VSL #3-2 daily.  Call in 1 month with an update. Our phone number is [phone].  Please avoid dairy products.  Follow up with Dr Rhea BeltonPyrtle in 3-4 months.

## 2014-12-05 ENCOUNTER — Ambulatory Visit: Payer: BLUE CROSS/BLUE SHIELD | Admitting: Cardiovascular Disease

## 2014-12-08 ENCOUNTER — Ambulatory Visit: Payer: BLUE CROSS/BLUE SHIELD | Admitting: Cardiovascular Disease

## 2014-12-18 ENCOUNTER — Other Ambulatory Visit: Payer: Self-pay | Admitting: Internal Medicine

## 2014-12-27 ENCOUNTER — Other Ambulatory Visit: Payer: Self-pay | Admitting: Internal Medicine

## 2014-12-27 ENCOUNTER — Telehealth: Payer: Self-pay | Admitting: Internal Medicine

## 2014-12-27 MED ORDER — LINACLOTIDE 145 MCG PO CAPS: ORAL_CAPSULE | ORAL | Status: DC

## 2014-12-27 NOTE — Telephone Encounter (Signed)
Left message for patient to call back. We actually did respond to refill request on 12/19/14. Rx was denied because she needs an office visit. She was to have 3-4 month follow up after her visit 05/2014. In addition, last rx was written for #15 capsules (1/2 cap daily) on 06/03/14 with 2 refils so she should have been out of the medication 09/02/14 if taking it properly.

## 2014-12-27 NOTE — Telephone Encounter (Signed)
I have spoken to patient to advise that she needs follow up office visit. She has scheduled for 03/13/14 @ 9:30 am. I have sent Linzess refills until that time. She verbalizes understanding.

## 2015-03-02 ENCOUNTER — Ambulatory Visit (INDEPENDENT_AMBULATORY_CARE_PROVIDER_SITE_OTHER): Payer: BLUE CROSS/BLUE SHIELD | Admitting: Cardiovascular Disease

## 2015-03-02 ENCOUNTER — Encounter: Payer: Self-pay | Admitting: Cardiovascular Disease

## 2015-03-02 VITALS — BP 110/70 | HR 54 | Ht 67.25 in | Wt 141.2 lb

## 2015-03-02 DIAGNOSIS — R001 Bradycardia, unspecified: Secondary | ICD-10-CM

## 2015-03-02 NOTE — Patient Instructions (Signed)
Medication Instructions:  Your physician recommends that you continue on your current medications as directed. Please refer to the Current Medication list given to you today.   Labwork: none  Testing/Procedures: none  Follow-Up: Your physician recommends that you schedule a follow-up appointment as needed.    Any Other Special Instructions Will Be Listed Below (If Applicable).     If you need a refill on your cardiac medications before your next appointment, please call your pharmacy.   

## 2015-03-02 NOTE — Progress Notes (Signed)
Chief Complaint  Patient presents with  . Follow-up    pt c/o occassional chest tightness and dizziness      History of Present Illness: 39 yo female with history of anemia, asthma here today for cardiac follow up. I saw her on 10/02/12 as a new patient and she c/o not been feeling well with chest discomfort, fatigue and SOB after recent pregnancy with normal delivery. She had been seen in the ED 09/30/12 and had mild anemia, normal BMET, negative troponin. EKG with sinus bradycardia, rate 44 bpm. CTA chest 10/02/12 with no evidence of PE. Echo 10/02/12 overall normal with normal LV systolic function. She had LE edema that responded well to Lasix. Heart rate responded normally to walking from 60 bpm up to 68 bpm. TSH was normal. Holter monitor with sinus brady, no pauses and rare PVCs. Exercise stress test 2014 with good heart rate response and no ischemic EKG changes.   She is here today for follow up. She has been feeling well. She has rare chest pains. She has no near syncope or syncope. HR noted to be in the 40s recently and she was referred back here for evaluation. No dyspnea or LE edema. She is exercising several days per week with no issues.     Primary Care Physician: Alyssa Martinez (Dr. Cyndia Martinez) OBGYN: Alyssa Martinez   Past Medical History  Diagnosis Date  . Anemia   . Hx of varicella   . Asthma   . AMA (advanced maternal age) multigravida 35+   . Hemorrhoid   . Irritable bowel syndrome   . Pneumonia     Mid teens to mid 20's  . Chronic constipation     Past Surgical History  Procedure Laterality Date  . Wisdom tooth extraction    . Breath tek h pylori N/A 05/05/2014    Procedure: BREATH TEK Alyssa Martinez;  Surgeon: Alyssa Fiedler, MD;  Location: [facility];  Service: Gastroenterology;  Laterality: N/A;    Current Outpatient Prescriptions  Medication Sig Dispense Refill  . albuterol (PROVENTIL HFA;VENTOLIN HFA) 108 (90 BASE) MCG/ACT inhaler Inhale 2 puffs into  the lungs every 6 (six) hours as needed for wheezing.    . hydrocortisone (ANUSOL-HC) 2.5 % rectal cream Place rectally 2 (two) times daily. Apply around anus for irritated & painful hemorrhoids (Patient taking differently: Place rectally as needed. Apply around anus for irritated & painful hemorrhoids) 15 g 2  . hydrocortisone-pramoxine (ANALPRAM-HC) 2.5-1 % rectal cream Place 1 application rectally 3 (three) times daily. Use a small amount 3-4 times daily. (Patient taking differently: Place 1 application rectally as needed. Use a small amount 3-4 times daily.) 30 g 0  . ibuprofen (ADVIL,MOTRIN) 100 MG tablet Take 100 mg by mouth every 6 (six) hours as needed for fever.    Marland Kitchen LINZESS 145 MCG CAPS capsule Take 72.5 mcg by mouth daily.  2  . loratadine (CLARITIN) 10 MG tablet Take 10 mg by mouth daily as needed for allergies.     . Multiple Vitamins-Minerals (MULTIVITAL PO) Take 1 tablet by mouth daily.     No current facility-administered medications for this visit.    No Known Allergies  Social History   Social History  . Marital Status: Married    Spouse Name: N/A  . Number of Children: 2  . Years of Education: N/A   Occupational History  . Homemaker    Social History Main Topics  . Smoking status: Former Smoker  Types: Cigarettes    Quit date: 02/19/2000  . Smokeless tobacco: Never Used  . Alcohol Use: Yes     Comment: Occassionally  . Drug Use: No  . Sexual Activity: Yes    Birth Control/ Protection: None   Other Topics Concern  . Not on file   Social History Narrative    Family History  Problem Relation Age of Onset  . Hypertension Mother   . Thyroid disease Mother   . Breast cancer Mother   . Urolithiasis Father   . Urolithiasis Brother   . Breast cancer Maternal Grandmother   . Breast cancer Paternal Grandmother   . Heart Problems Paternal Grandfather     Review of Systems:  As stated in the HPI and otherwise negative.   BP 110/70 mmHg  Pulse 54  Ht 5'  7.25" (1.708 m)  Wt 141 lb 3.2 oz (64.048 kg)  BMI 21.95 kg/m2  SpO2 98%  Physical Examination: General: Well developed, well nourished, NAD HEENT: OP clear, mucus membranes moist SKIN: warm, dry. No rashes. Neuro: No focal deficits Musculoskeletal: Muscle strength 5/5 all ext Psychiatric: Mood and affect normal Neck: No JVD, no carotid bruits, no thyromegaly, no lymphadenopathy. Lungs:Clear bilaterally, no wheezes, rhonci, crackles Cardiovascular: Alyssa Martinez, regular rhythm. No murmurs, gallops or rubs. Abdomen:Soft. Bowel sounds present. Non-tender.  Extremities: No lower extremity edema. Pulses are 2 + in the bilateral DP/PT.  Echo 10/02/12: Left ventricle: The cavity size was normal. Wall thickness was normal. Systolic function was normal. The estimated ejection fraction was in the range of 60% to 65%.  EKG:  EKG is ordered today. The ekg ordered today demonstrates Sinus brady, rate 56 bpm.  Recent Labs: No results found for requested labs within last 365 days.   Lipid Panel No results found for: CHOL, TRIG, HDL, CHOLHDL, VLDL, LDLCALC, LDLDIRECT   Wt Readings from Last 3 Encounters:  03/02/15 141 lb 3.2 oz (64.048 kg)  05/31/14 141 lb 4 oz (64.071 kg)  04/06/14 136 lb 3.2 oz (61.78 kg)     Other studies Reviewed: Additional studies/ records that were reviewed today include: . Review of the above records demonstrates:    Assessment and Plan:   1. Sinus Bradycardia:  Echo normal. TSH normal. Exercise stress test 2014 with no ischemia. Excellent heart rate response to exercise. 48 hour monitor with sinus brady 2014 but no pauses. I have reassured her that she has a young healthy heart and as long as she is not having dizziness or syncope, no further evaluation is needed.   Current medicines are reviewed at length with the patient today.  The patient does not have concerns regarding medicines.  The following changes have been made:  no change  Labs/ tests ordered today  include:   Orders Placed This Encounter  Procedures  . EKG 12-Lead    Disposition:   FU with me as needed.    Signed, Alyssa Carrowhristopher Adamarie Izzo, MD 03/02/2015 1:08 PM    [facility] [address], [place], [place]  [address] Phone: [phone]; Fax: [phone]

## 2015-03-14 ENCOUNTER — Encounter: Payer: Self-pay | Admitting: Internal Medicine

## 2015-03-14 ENCOUNTER — Ambulatory Visit (INDEPENDENT_AMBULATORY_CARE_PROVIDER_SITE_OTHER): Payer: BLUE CROSS/BLUE SHIELD | Admitting: Internal Medicine

## 2015-03-14 VITALS — BP 110/68 | HR 62 | Ht 67.25 in | Wt 144.6 lb

## 2015-03-14 DIAGNOSIS — K5900 Constipation, unspecified: Secondary | ICD-10-CM

## 2015-03-14 DIAGNOSIS — R12 Heartburn: Secondary | ICD-10-CM

## 2015-03-14 DIAGNOSIS — K649 Unspecified hemorrhoids: Secondary | ICD-10-CM

## 2015-03-14 DIAGNOSIS — K5909 Other constipation: Secondary | ICD-10-CM

## 2015-03-14 DIAGNOSIS — R140 Abdominal distension (gaseous): Secondary | ICD-10-CM | POA: Diagnosis not present

## 2015-03-14 MED ORDER — LINACLOTIDE 145 MCG PO CAPS: 145.0000 ug | ORAL_CAPSULE | Freq: Every day | ORAL | Status: DC

## 2015-03-14 NOTE — Patient Instructions (Addendum)
We have sent the following medications to your pharmacy for you to pick up at your convenience: Linzess 145 mcg- 1/2 dose daily.  Please purchase the following medications over the counter and take as directed: Benefiber 1 tablespoon daily x 1 week, increasing to 2 tablespoons daily   Call us if you decide you would like to have hemorrhoidal banding.  Follow up with Dr Rhea Belton in 1 year.

## 2015-03-14 NOTE — Progress Notes (Signed)
Subjective:    Patient ID: Alyssa Martinez, female    DOB: 12/14/1975, 40 y.o.   MRN: [id number]  HPI Alyssa Martinez is a 40 year old female with past medical history of chronic constipation associated with abdominal bloating, H. pylori gastritis status post treatment who is here for follow-up. She is here alone today. She was last seen in April 2016. She has been using Linzess 72.5 g daily which is working well for her on most days. There are some days when she seems to have less of a response and will not have a bowel movement. Occasionally, though rarely, she will use an entire capsule which is 145 g daily. For the most part abdominal bloating and fullness is well controlled with a Linzess. Her stool response to Linzess is loose to watery. She tried VSL 3 but this seemed to cause abdominal aching and burning so she stopped it. She occasionally has nighttime heartburn and uses when necessary Zantac. No nausea or vomiting. No dysphagia or odynophagia. No early satiety. Weight is stable but fluctuates 2-4 pounds which bothers her. She feels like this may be due to bloating or fluid retention. She denies lower extremity edema. She has started working out again. Periods recently have been more irregular and she is going to discuss this with her gynecologist Dr. Renaldo Fiddler.  She has a history of external hemorrhoid with thrombosis. This was treated with incision and drainage 3 times on one episode of severe external hemorrhoid thrombosis. This was exquisitely painful. She has skin tags and at times feels prolapsing. She is not currently been having issues with rectal bleeding. She lives in fear of these hemorrhoids particularly as relates to recurrence. All symptoms seem to start after childbirth. This even makes her not want to have more children given the severity of symptoms.  She was recently seen by cardiology to evaluate sinus bradycardia which is felt to be benign. Previous stress test  echocardiogram and Holter monitor were unrevealing. Reassurance was provided by cardiology  Review of Systems As per history of present illness, otherwise negative  Current Medications, Allergies, Past Medical History, Past Surgical History, Family History and Social History were reviewed in [employer] record.     Objective:   Physical Exam  BP 110/68 mmHg  Pulse 62  Ht 5' 7.25" (1.708 m)  Wt 144 lb 9.6 oz (65.59 kg)  BMI 22.48 kg/m2  LMP 03/01/2015 (Exact Date) Constitutional: Well-developed and well-nourished. No distress. HEENT: Normocephalic and atraumatic. Conjunctivae are normal.  No scleral icterus. Neck: Neck supple. Trachea midline. Cardiovascular: Normal rate, regular rhythm and intact distal pulses. No M/R/G Pulmonary/chest: Effort normal and breath sounds normal. No wheezing, rales or rhonchi. Abdominal: Soft, nontender, nondistended. Bowel sounds active throughout. There are no masses palpable. No hepatosplenomegaly. Extremities: no clubbing, cyanosis, or edema Neurological: Alert and oriented to person place and time. Skin: Skin is warm and dry. Psychiatric: Normal mood and affect. Behavior is normal.     Assessment & Plan:  40 year old female with past medical history of chronic constipation associated with abdominal bloating, H. pylori gastritis status post treatment who is here for follow-up.   1. Chronic constipation/abd bloating -- she has responded more favorably to Linzess at 72.5 g daily. I'm told by representatives of the drug company that this dose is going to be commercially available soon. We will prescribe this dose for her when it is available. She has not had a perfect response to Linzess as her stools are often loose and  watery. No response previously to MiraLAX or Amitiza both of which were not efficacious and caused bloating. I'm going to add Benefiber 1 tablespoon gradually increasing to 2 tablespoons daily to try to bulk the  stool to make the stools more formed and less watery. She did not have favorable response to VSL 3.  2. Hemorrhoids -- likely commendation of both internal and external hemorrhoids. We had a long discussion today regarding internal hemorrhoidal banding. Often times external hemorrhoids decreased by 50% in size when internal hemorrhoids are treated with banding. Also external symptoms can be relieved with internal hemorrhoidal banding. For her this would be particularly prolapse. She does understand that external hemorrhoids alone cannot be treated by banding. After discussed discussion she would like to think more about potential internal hemorrhoid treatment and notify me if interested.  3. Intermittent heartburn -- responds well to ranitidine 150 mg each evening as needed. No alarm symptoms  25 minutes spent with the patient today. Greater than 50% was spent in counseling and coordination of care with the patient   One year follow-up, sooner if necessary Copy PCP -- Dani Gobble, PA-C

## 2015-05-07 IMAGING — CR DG CHEST 2V
2 series · 2 of 2 positions shown · non-contrast
Comparison: None

CLINICAL DATA: Hypertension.  Chest pain with inspiration.

CHEST - 2 VIEW

[view not recorded (1 of 2)]
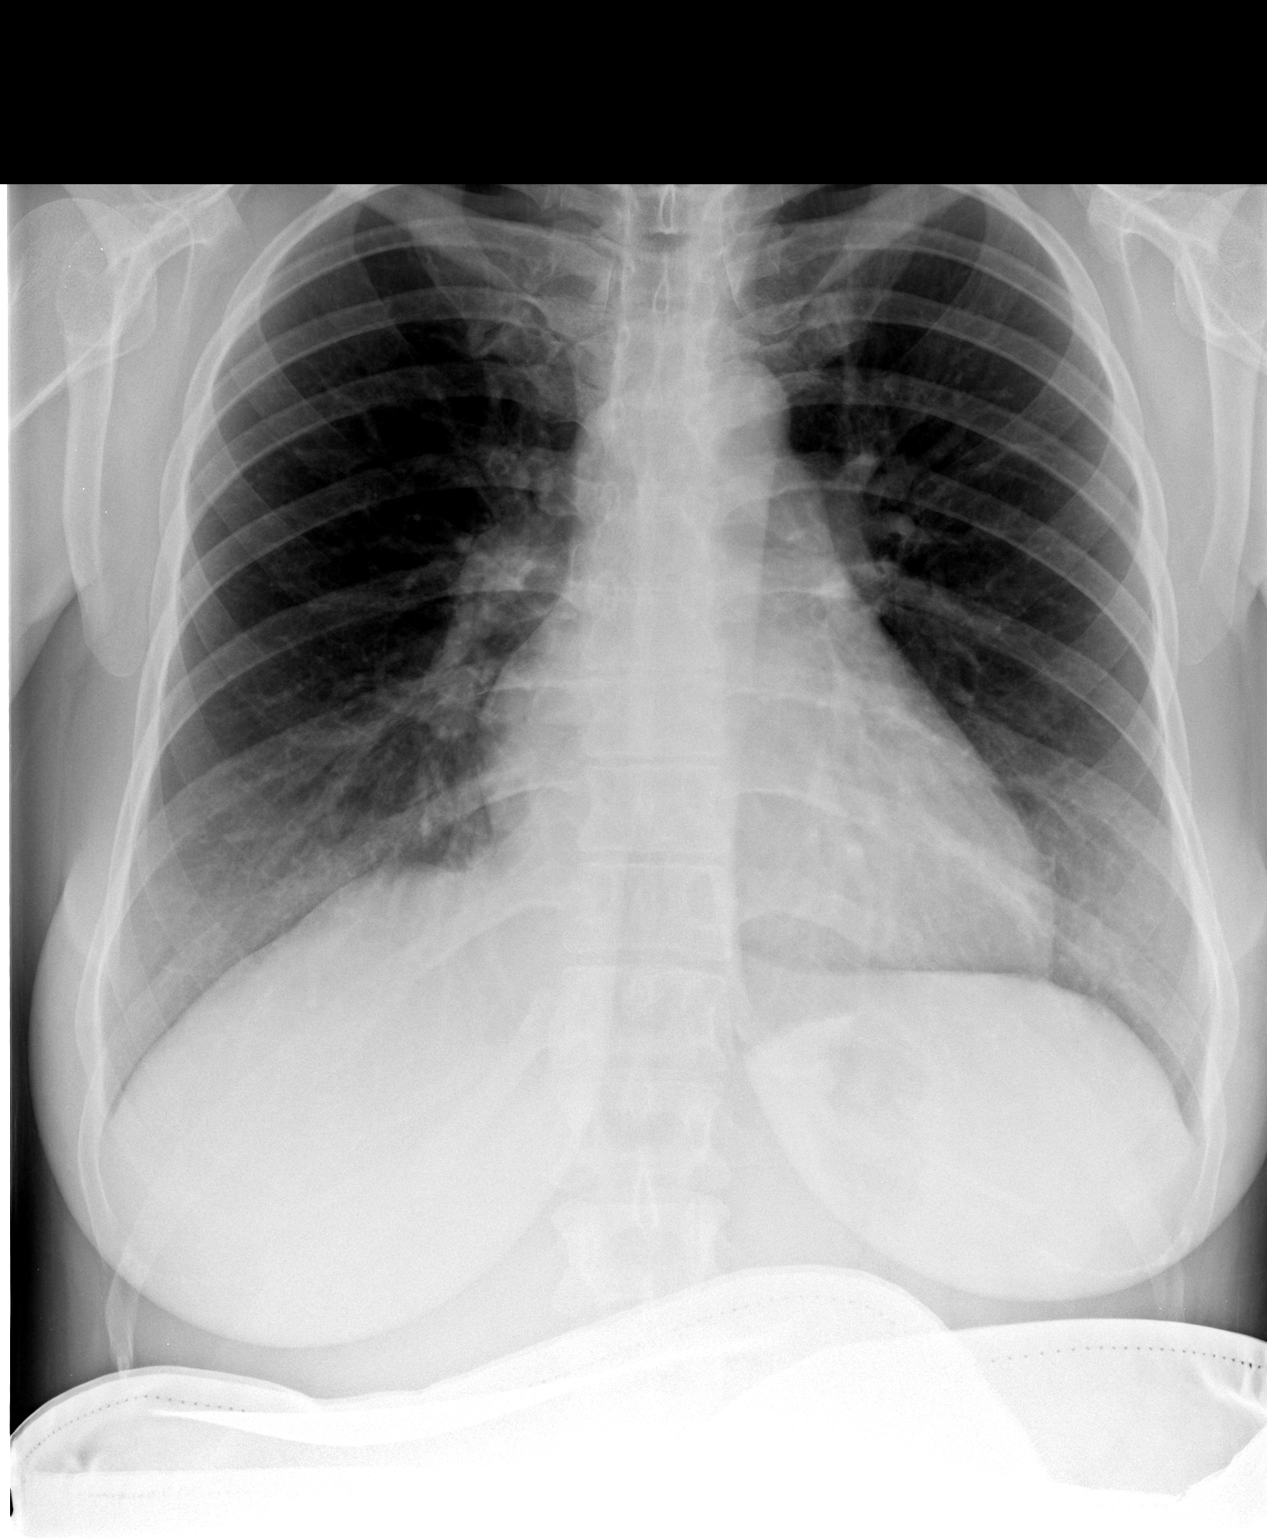

[view not recorded (2 of 2)]
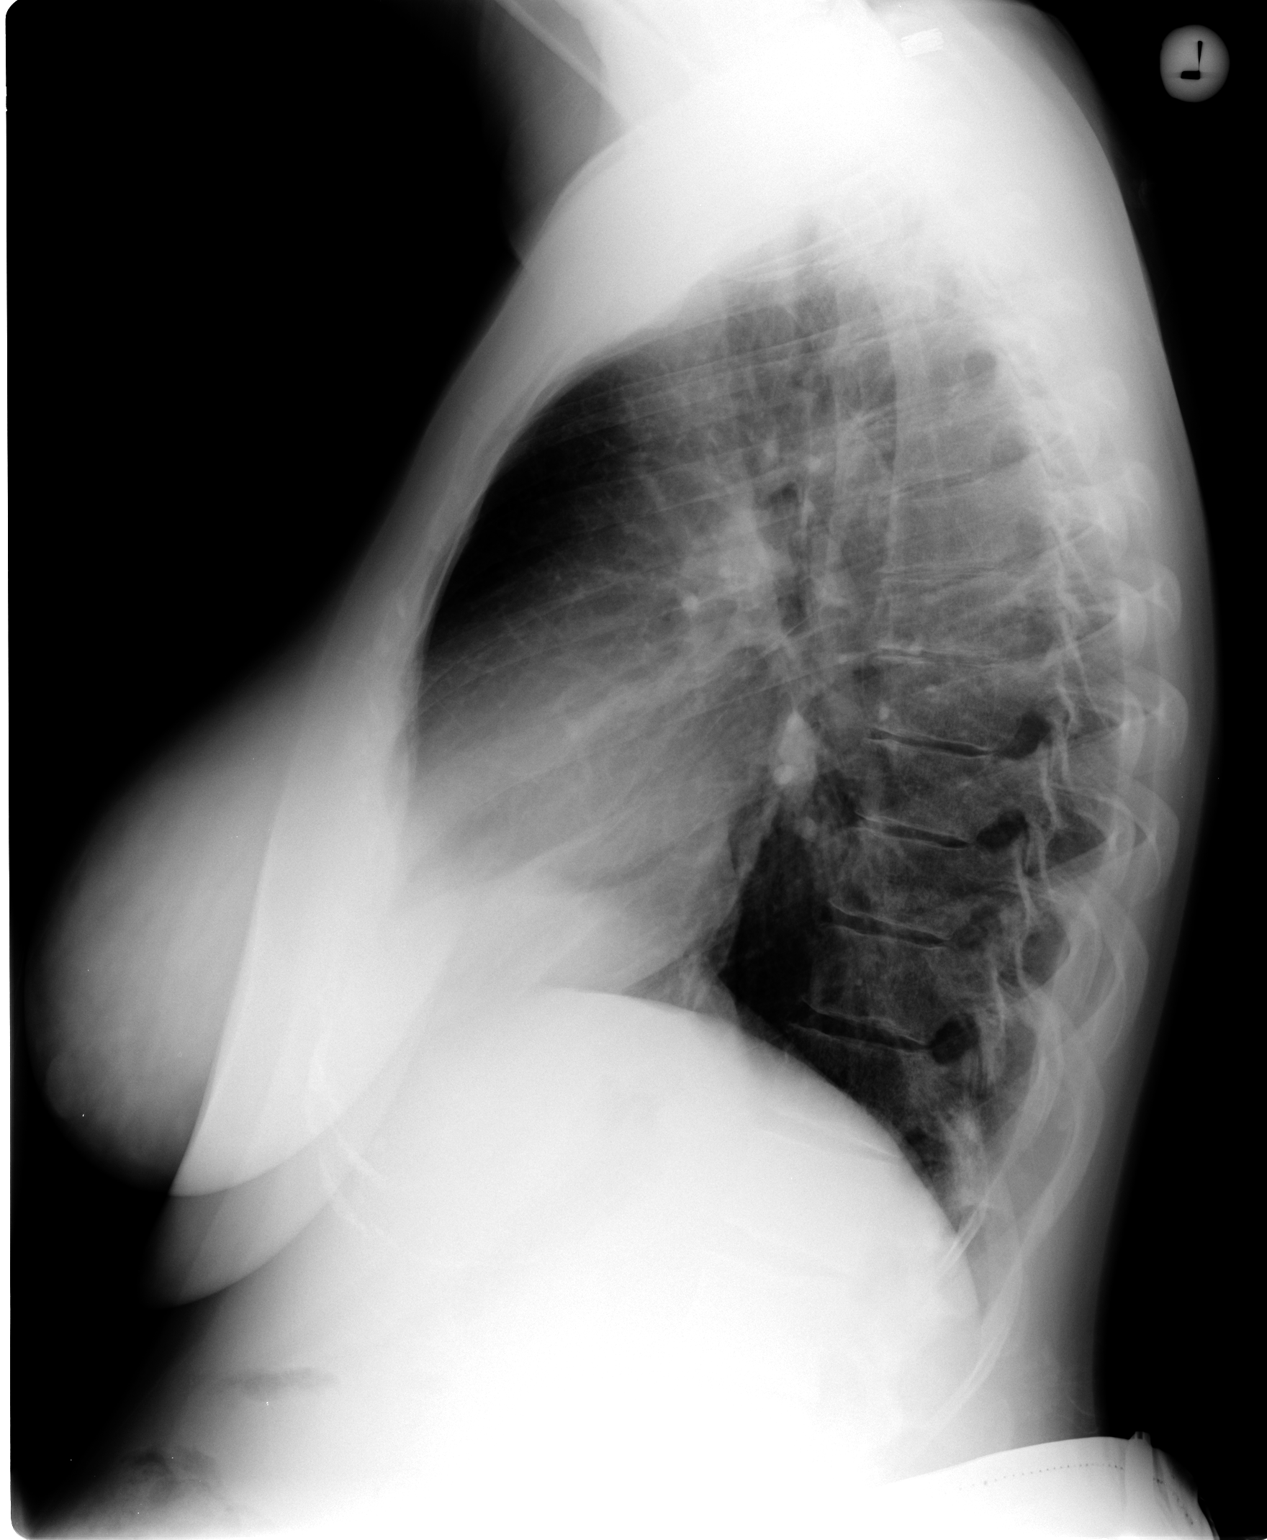

[2 of 2 positions shown; findings below may reference images not displayed]

FINDINGS: The heart and mediastinum are normal.  The lungs are
clear.  Vascularity is normal.  No effusions.  No bony
abnormalities.
IMPRESSION: Normal chest

## 2015-07-16 ENCOUNTER — Other Ambulatory Visit: Payer: Self-pay | Admitting: Internal Medicine

## 2015-07-18 MED ORDER — LINACLOTIDE 72 MCG PO CAPS: 72.0000 ug | ORAL_CAPSULE | Freq: Every day | ORAL | Status: DC

## 2015-07-18 NOTE — Telephone Encounter (Signed)
I have spoken to patient to advise that we will send the 72 mcg Linzess now rather than the 145 mcg capsules as previously (Dr Lauro FranklinPyrtle's last office note indicates that we will switch her to the lower dose when commercially available). She verbalizes understanding. However, she indicates that she may take a break from the medication as she has had a fairly rapid weight gain which she attributes to the medication.

## 2017-11-20 ENCOUNTER — Other Ambulatory Visit: Payer: Self-pay | Admitting: Obstetrics and Gynecology

## 2017-11-20 DIAGNOSIS — Z803 Family history of malignant neoplasm of breast: Secondary | ICD-10-CM

## 2019-04-24 ENCOUNTER — Ambulatory Visit: Payer: Self-pay | Attending: Internal Medicine

## 2019-04-24 DIAGNOSIS — Z23 Encounter for immunization: Secondary | ICD-10-CM | POA: Insufficient documentation

## 2019-04-24 NOTE — Progress Notes (Signed)
   Covid-19 Vaccination Clinic  Name:  Alyssa Martinez    MRN: [id number] DOB: May 21, 1975  04/24/2019  [name] was observed post Covid-19 immunization for 15 minutes without incident. She was provided with Vaccine Information Sheet and instruction to access the V-Safe system.   [name] was instructed to call 911 with any severe reactions post vaccine: Marland Kitchen Difficulty breathing  . Swelling of face and throat  . A fast heartbeat  . A bad rash all over body  . Dizziness and weakness   Immunizations Administered    Name Date Dose VIS Date Route   Pfizer COVID-19 Vaccine 04/24/2019  2:07 PM 0.3 mL 01/29/2019 Intramuscular   Manufacturer: [employer], [employer]   Lot: [id number]   NDC: [address]-2

## 2019-05-15 ENCOUNTER — Ambulatory Visit: Payer: Self-pay | Attending: Internal Medicine

## 2019-05-15 DIAGNOSIS — Z23 Encounter for immunization: Secondary | ICD-10-CM

## 2019-05-15 NOTE — Progress Notes (Signed)
   Covid-19 Vaccination Clinic  Name:  Prue Lingenfelter    MRN: [id number] DOB: 04-14-1975  05/15/2019  Ms. Depolo was observed post Covid-19 immunization for 15 minutes without incident. She was provided with Vaccine Information Sheet and instruction to access the V-Safe system.   Ms. Bieri was instructed to call 911 with any severe reactions post vaccine: Marland Kitchen Difficulty breathing  . Swelling of face and throat  . A fast heartbeat  . A bad rash all over body  . Dizziness and weakness   Immunizations Administered    Name Date Dose VIS Date Route   Pfizer COVID-19 Vaccine 05/15/2019  9:13 AM 0.3 mL 01/29/2019 Intramuscular   Manufacturer: [employer], [employer]   Lot: [id number]   NDC: [address]-9

## 2019-09-19 ENCOUNTER — Ambulatory Visit (HOSPITAL_COMMUNITY)
Admission: EM | Admit: 2019-09-19 | Discharge: 2019-09-19 | Disposition: A | Discharge status: Home / Self Care | Payer: BC Managed Care – PPO | Attending: Family Medicine | Admitting: Family Medicine

## 2019-09-19 ENCOUNTER — Encounter (HOSPITAL_COMMUNITY): Payer: Self-pay

## 2019-09-19 ENCOUNTER — Other Ambulatory Visit: Payer: Self-pay

## 2019-09-19 ENCOUNTER — Ambulatory Visit (INDEPENDENT_AMBULATORY_CARE_PROVIDER_SITE_OTHER): Payer: BC Managed Care – PPO

## 2019-09-19 DIAGNOSIS — M25572 Pain in left ankle and joints of left foot: Secondary | ICD-10-CM

## 2019-09-19 DIAGNOSIS — S93492A Sprain of other ligament of left ankle, initial encounter: Secondary | ICD-10-CM

## 2019-09-19 DIAGNOSIS — S99912A Unspecified injury of left ankle, initial encounter: Secondary | ICD-10-CM

## 2019-09-19 NOTE — ED Provider Notes (Signed)
[facility]    CSN: [id number] Arrival date & time: 09/19/19  1040      History   Chief Complaint Chief Complaint  Patient presents with  . Ankle Injury    HPI Alyssa Martinez is a 44 y.o. female.   She is presenting with left ankle pain.  Is occurring over the anterior aspect of the ankle.  She felt a pop in it.  She is able to walk with minimal pain.  No history of similar pain.  Pain is anterior over the ankle joint.  Mild swelling.  No ecchymosis.  HPI  Past Medical History:  Diagnosis Date  . AMA (advanced maternal age) multigravida 35+   . Anemia   . Asthma   . Chronic constipation   . Hemorrhoid   . Hx of varicella   . Irritable bowel syndrome   . Pneumonia    Mid teens to mid 20's    Patient Active Problem List   Diagnosis Date Noted  . Chronic constipation 10/29/2013  . External hemorrhoids 12/01/2012  . Dyspnea 10/02/2012  . Sinus bradycardia 10/02/2012  . 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 BREATH TEK H PYLORI N/A 05/05/2014   Procedure: BREATH TEK H PYLORI;  Surgeon: Beverley Fiedler, MD;  Location: [facility];  Service: Gastroenterology;  Laterality: N/A;  . WISDOM TOOTH EXTRACTION      OB History    Gravida  2   Para  2   Term  2   Preterm      AB      Living  2     SAB      TAB      Ectopic      Multiple      Live Births  2            Home Medications    Prior to Admission medications   Medication Sig Start Date End Date Taking? Authorizing Provider  docusate sodium (COLACE) 100 MG capsule Take 100 mg by mouth 2 (two) times daily.   Yes [provider]  levocetirizine (XYZAL) 5 MG tablet Take 5 mg by mouth every evening.   Yes [provider]  albuterol (PROVENTIL HFA;VENTOLIN HFA) 108 (90 BASE) MCG/ACT inhaler Inhale 2 puffs into the lungs every 6 (six) hours as needed for wheezing.    [provider]  calcium  carbonate (TUMS - DOSED IN MG ELEMENTAL CALCIUM) 500 MG chewable tablet Chew 1 tablet by mouth as needed for indigestion or heartburn.    [provider]  hydrocortisone (ANUSOL-HC) 2.5 % rectal cream Place rectally 2 (two) times daily. Apply around anus for irritated & painful hemorrhoids Patient taking differently: Place rectally as needed. Apply around anus for irritated & painful hemorrhoids 12/01/12   Romie Levee, MD  hydrocortisone-pramoxine [facility]) 2.5-1 % rectal cream Place 1 application rectally 3 (three) times daily. Use a small amount 3-4 times daily. Patient taking differently: Place 1 application rectally as needed. Use a small amount 3-4 times daily. 10/29/13   Esterwood, Amy S, PA-C  ibuprofen (ADVIL,MOTRIN) 100 MG tablet Take 100 mg by mouth every 6 (six) hours as needed for fever.    [provider]  Multiple Vitamins-Minerals (MULTIVITAL PO) Take 1 tablet by mouth daily.    [provider]  linaclotide [facility]) 72 MCG capsule Take 1 capsule (72 mcg total) by mouth daily before breakfast. 07/18/15 09/19/19  Pyrtle, Carie Caddy,  MD  loratadine (CLARITIN) 10 MG tablet Take 10 mg by mouth daily as needed for allergies.   09/19/19  [provider]  ranitidine (ZANTAC) 150 MG tablet Take 150 mg by mouth as needed for heartburn.  09/19/19  [provider]    Family History Family History  Problem Relation Age of Onset  . Hypertension Mother   . Thyroid disease Mother   . Breast cancer Mother   . Urolithiasis Father   . Urolithiasis Brother   . Breast cancer Maternal Grandmother   . Breast cancer Paternal Grandmother   . Heart Problems Paternal Grandfather     Social History Social History   Tobacco Use  . Smoking status: Former Smoker    Types: Cigarettes    Quit date: 02/19/2000    Years since quitting: 19.5  . Smokeless tobacco: Never Used  Substance Use Topics  . Alcohol use: Yes    Comment: Occassionally  . Drug use: No      Allergies   Patient has no known allergies.   Review of Systems Review of Systems  See HPI  Physical Exam Triage Vital Signs ED Triage Vitals  Enc Vitals Group     BP 09/19/19 1124 121/79     Pulse Rate 09/19/19 1124 62     Resp 09/19/19 1124 16     Temp 09/19/19 1124 98.4 F (36.9 C)     Temp Source 09/19/19 1124 Oral     SpO2 09/19/19 1124 100 %     Weight 09/19/19 1129 148 lb (67.1 kg)     Height 09/19/19 1129 5\' 7"  (1.702 m)     Head Circumference --      Peak Flow --      Pain Score 09/19/19 1129 0     Pain Loc --      Pain Edu? --      Excl. in GC? --    No data found.  Updated Vital Signs BP 121/79 (BP Location: Left Arm)   Pulse 62   Temp 98.4 F (36.9 C) (Oral)   Resp 16   Ht 5\' 7"  (1.702 m)   Wt 67.1 kg   SpO2 100%   BMI 23.18 kg/m   Visual Acuity Right Eye Distance:   Left Eye Distance:   Bilateral Distance:    Right Eye Near:   Left Eye Near:    Bilateral Near:     Physical Exam Gen: NAD, alert, cooperative with exam, well-appearing ENT: normal lips, normal nasal mucosa,   Neuro: normal tone, normal sensation to touch Psych:  normal insight, alert and oriented MSK:  Left ankle: Mild swelling over the dorsum of the foot and ankle. Tenderness palpation over the lateral malleolus. No ecchymosis. No translation with anterior drawer posterior drawer. Neurovascularly intact   UC Treatments / Results  Labs (all labs ordered are listed, but only abnormal results are displayed) Labs Reviewed - No data to display  EKG   Radiology DG Ankle Complete Left  Result Date: 09/19/2019 CLINICAL DATA:  Injury, pain. EXAM: LEFT ANKLE COMPLETE - 3+ VIEW COMPARISON:  None. FINDINGS: Osseous alignment is normal. Ankle mortise is symmetric. No fracture line or displaced fracture fragment is seen. Visualized portions of the hindfoot and midfoot are unremarkable. Soft tissues about the LEFT ankle are unremarkable. IMPRESSION: Negative.  Electronically Signed   By: M.D.   On: 09/19/2019 12:12    Procedures Procedures (including critical care time)  Medications Ordered in UC Medications -  No data to display  Initial Impression / Assessment and Plan / UC Course  I have reviewed the triage vital signs and the nursing notes.  Pertinent labs & imaging results that were available during my care of the patient were reviewed by me and considered in my medical decision making (see chart for details).     Ms. Otwell is a 44 year old female that is presenting with left ankle pain.  Imaging was negative for fracture.  Possible for ligamentous injury.  Provided an ankle brace.  Counseled on supportive care.  Given indications to follow-up.  Final Clinical Impressions(s) / UC Diagnoses   Final diagnoses:  Sprain of anterior talofibular ligament of left ankle, initial encounter     Discharge Instructions     Please try ice  Please try the ankle brace  Please follow up if your symptoms fail to improve.     ED Prescriptions    None     PDMP not reviewed this encounter.   Myra Rude, MD 09/19/19 [phone]

## 2019-09-19 NOTE — Discharge Instructions (Addendum)
Please try ice  Please try the ankle brace  Please follow up if your symptoms fail to improve.

## 2019-09-19 NOTE — ED Triage Notes (Addendum)
Pt states she was carrying something and missed a step and her left ankle rolled and she heard a "pop" in her ankle. PT states she can walk on it. Pt states it's painful to turn ankle inward and to walk on heel. Pt has trace swelling of malleous of left ankle. Pt denies numbness and tingling.

## 2019-10-26 ENCOUNTER — Other Ambulatory Visit: Payer: Self-pay | Admitting: Obstetrics and Gynecology

## 2019-10-26 DIAGNOSIS — Z9189 Other specified personal risk factors, not elsewhere classified: Secondary | ICD-10-CM

## 2020-01-30 ENCOUNTER — Telehealth: Payer: Self-pay | Admitting: Gastroenterology

## 2020-01-30 DIAGNOSIS — R112 Nausea with vomiting, unspecified: Secondary | ICD-10-CM

## 2020-01-30 MED ORDER — ONDANSETRON 4 MG PO TBDP: 4.0000 mg | ORAL_TABLET | Freq: Three times a day (TID) | ORAL | 0 refills | Status: DC | PRN

## 2020-01-30 NOTE — Telephone Encounter (Signed)
Patient known to our practice. Called about acute onset nausea / vomiting, poor PO intake. Symptoms started today. No fever. No abdominal pain. Fatigue. No other alarm symptoms. Possible gastroenteritis. Will prescribe some Zofran 4mg  ODT initially to see if that helps. Advised to contact if symptoms persist / worsen, etc. She agreed.

## 2020-01-31 ENCOUNTER — Encounter (HOSPITAL_BASED_OUTPATIENT_CLINIC_OR_DEPARTMENT_OTHER): Payer: Self-pay | Admitting: *Deleted

## 2020-01-31 ENCOUNTER — Other Ambulatory Visit: Payer: Self-pay

## 2020-01-31 ENCOUNTER — Emergency Department (HOSPITAL_BASED_OUTPATIENT_CLINIC_OR_DEPARTMENT_OTHER)
Admission: EM | Admit: 2020-01-31 | Discharge: 2020-01-31 | Disposition: A | Discharge status: Home / Self Care | Payer: BC Managed Care – PPO | Attending: Emergency Medicine | Admitting: Emergency Medicine

## 2020-01-31 DIAGNOSIS — Z87891 Personal history of nicotine dependence: Secondary | ICD-10-CM | POA: Insufficient documentation

## 2020-01-31 DIAGNOSIS — J45909 Unspecified asthma, uncomplicated: Secondary | ICD-10-CM | POA: Insufficient documentation

## 2020-01-31 DIAGNOSIS — R112 Nausea with vomiting, unspecified: Secondary | ICD-10-CM | POA: Insufficient documentation

## 2020-01-31 DIAGNOSIS — R110 Nausea: Secondary | ICD-10-CM

## 2020-01-31 LAB — LIPASE, BLOOD: Lipase: 31 U/L (ref 11–51)

## 2020-01-31 LAB — COMPREHENSIVE METABOLIC PANEL
ALT: 14 U/L (ref 0–44)
AST: 16 U/L (ref 15–41)
Albumin: 4.8 g/dL (ref 3.5–5.0)
Alkaline Phosphatase: 42 U/L (ref 38–126)
Anion gap: 9 (ref 5–15)
BUN: 13 mg/dL (ref 6–20)
CO2: 27 mmol/L (ref 22–32)
Calcium: 10.1 mg/dL (ref 8.9–10.3)
Chloride: 100 mmol/L (ref 98–111)
Creatinine, Ser: 0.89 mg/dL (ref 0.44–1.00)
GFR, Estimated: >60 mL/min (ref >60)
Glucose, Bld: 102 mg/dL — ABNORMAL HIGH (ref 70–99)
Potassium: 3.5 mmol/L (ref 3.5–5.1)
Sodium: 136 mmol/L (ref 135–145)
Total Bilirubin: 0.8 mg/dL (ref 0.3–1.2)
Total Protein: 7.7 g/dL (ref 6.5–8.1)

## 2020-01-31 LAB — PREGNANCY, URINE: Preg Test, Ur: NEGATIVE

## 2020-01-31 MED ORDER — SODIUM CHLORIDE 0.9 % IV BOLUS
1000.0000 mL | Freq: Once | INTRAVENOUS | Status: AC
  Administered 2020-01-31: 1000 mL via INTRAVENOUS

## 2020-01-31 MED ORDER — ONDANSETRON HCL 4 MG/2ML IJ SOLN
4.0000 mg | Freq: Once | INTRAMUSCULAR | Status: AC
  Administered 2020-01-31: 4 mg via INTRAVENOUS
  Filled 2020-01-31: qty 2

## 2020-01-31 MED ORDER — ONDANSETRON HCL 4 MG PO TABS: 4.0000 mg | ORAL_TABLET | Freq: Three times a day (TID) | ORAL | 0 refills | Status: AC | PRN

## 2020-01-31 NOTE — Discharge Instructions (Addendum)
You came to the hospital to be evaluated for your nausea and vomiting.  Your lab work and physical exam were reassuring.  You were given fluids to help with dehydration and Zofran to help with your nausea.  I have given you a prescription of Zofran to use as needed every 8 hours for nausea and vomiting.  Drink clear liquids until your stomach feels better. Then, slowly introduce bland foods into your diet as tolerated, such as bread, rice, apples, bananas. Follow up with your primary care if symptoms persist.  Return to the emergency department if: You have pain in your chest, neck, arm, or jaw. You feel extremely weak or you faint. You have persistent vomiting. You have vomit that is bright red or looks like black coffee grounds. You have bloody or black stools or stools that look like tar. You have a severe headache, a stiff neck, or both. You have severe pain, cramping, or bloating in your abdomen. You have difficulty breathing, or you are breathing very quickly. Your heart is beating very quickly. Your skin feels cold and clammy. You feel confused. You have signs of dehydration, such as: Dark urine, very little urine, or no urine. Cracked lips. Dry mouth. Sunken eyes. Sleepiness. Weakness.

## 2020-01-31 NOTE — ED Triage Notes (Signed)
Pt. Believes she may have had too many alcoholic drinks on Sat. Night and she started vomiting on early morning Sunday morning and just stopped at 2130 last night.  Pt. Reports she feel awful and poss. Dehydrated.

## 2020-01-31 NOTE — ED Provider Notes (Signed)
[facility] EMERGENCY DEPARTMENT Provider Note   CSN: [id number] Arrival date & time: 01/31/20  1517     History Chief Complaint  Patient presents with  . Nausea    Alyssa Martinez is a 44 y.o. female past medical history of sinus bradycardia, asthma, irritable bowel syndrome.  Patient presents with a chief complaint of nausea and vomiting.  Reports that her nausea and vomiting started Saturday night she drank more alcohol beverages than she usually does.  She is not sure how many times she has vomited in the last 24 hours.  Patient denies any hematemesis, or coffee-ground emesis.  Patient reports that she took a Zofran this morning at 11 AM, has not vomited since then however her nausea has been constant.  Patient denies any abdominal pain, fevers, chills, chest pain, shortness of breath, diarrhea, constipation, blood in stool, dysuria, lightheadedness, dizziness, syncope.  Patient is unsure when her last menstrual period was because she has an IUD in place.      HPI     Past Medical History:  Diagnosis Date  . AMA (advanced maternal age) multigravida 35+   . Anemia   . Asthma   . Chronic constipation   . Hemorrhoid   . Hx of varicella   . Irritable bowel syndrome   . Pneumonia    Mid teens to mid 20's    Patient Active Problem List   Diagnosis Date Noted  . Chronic constipation 10/29/2013  . External hemorrhoids 12/01/2012  . Dyspnea 10/02/2012  . Sinus bradycardia 10/02/2012  . 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 BREATH TEK H PYLORI N/A 05/05/2014   Procedure: BREATH TEK H PYLORI;  Surgeon: Beverley Fiedler, MD;  Location: [facility];  Service: Gastroenterology;  Laterality: N/A;  . WISDOM TOOTH EXTRACTION       OB History    Gravida  2   Para  2   Term  2   Preterm      AB      Living  2     SAB      IAB      Ectopic      Multiple      Live Births  2            Family History  Problem Relation Age of Onset  . Hypertension Mother   . Thyroid disease Mother   . Breast cancer Mother   . Urolithiasis Father   . Urolithiasis Brother   . Breast cancer Maternal Grandmother   . Breast cancer Paternal Grandmother   . Heart Problems Paternal Grandfather     Social History   Tobacco Use  . Smoking status: Former Smoker    Types: Cigarettes    Quit date: 02/19/2000    Years since quitting: 19.9  . Smokeless tobacco: Never Used  Substance Use Topics  . Alcohol use: Yes    Comment: Occassionally  . Drug use: No    Home Medications Prior to Admission medications   Medication Sig Start Date End Date Taking? Authorizing Provider  albuterol (PROVENTIL HFA;VENTOLIN HFA) 108 (90 BASE) MCG/ACT inhaler Inhale 2 puffs into the lungs every 6 (six) hours as needed for wheezing.   Yes [provider]  docusate sodium (COLACE) 100 MG capsule Take 100 mg by mouth 2 (two) times daily.   Yes [provider]  ibuprofen (ADVIL,MOTRIN) 100 MG tablet Take 100 mg by mouth every 6 (  six) hours as needed for fever.   Yes [provider]  levocetirizine (XYZAL) 5 MG tablet Take 5 mg by mouth every evening.   Yes [provider]  ondansetron (ZOFRAN-ODT) 4 MG disintegrating tablet Take 1 tablet (4 mg total) by mouth every 8 (eight) hours as needed for nausea or vomiting (take 1 to 2 tabs every 8 hours as needed). 01/30/20  Yes Armbruster, Willaim Rayas, MD  calcium carbonate (TUMS - DOSED IN MG ELEMENTAL CALCIUM) 500 MG chewable tablet Chew 1 tablet by mouth as needed for indigestion or heartburn.    [provider]  hydrocortisone (ANUSOL-HC) 2.5 % rectal cream Place rectally 2 (two) times daily. Apply around anus for irritated & painful hemorrhoids Patient taking differently: Place rectally as needed. Apply around anus for irritated & painful hemorrhoids 12/01/12   Romie Levee, MD  hydrocortisone-pramoxine [facility]) 2.5-1 %  rectal cream Place 1 application rectally 3 (three) times daily. Use a small amount 3-4 times daily. Patient taking differently: Place 1 application rectally as needed. Use a small amount 3-4 times daily. 10/29/13   Esterwood, Amy S, PA-C  Multiple Vitamins-Minerals (MULTIVITAL PO) Take 1 tablet by mouth daily.    [provider]  ondansetron (ZOFRAN) 4 MG tablet Take 1 tablet (4 mg total) by mouth every 8 (eight) hours as needed for nausea or vomiting. 01/31/20   Haskel Schroeder, PA-C  linaclotide [facility]) 72 MCG capsule Take 1 capsule (72 mcg total) by mouth daily before breakfast. 07/18/15 09/19/19  Pyrtle, Carie Caddy, MD  loratadine (CLARITIN) 10 MG tablet Take 10 mg by mouth daily as needed for allergies.   09/19/19  [provider]  ranitidine (ZANTAC) 150 MG tablet Take 150 mg by mouth as needed for heartburn.  09/19/19  [provider]    Allergies    Patient has no known allergies.  Review of Systems   Review of Systems  Constitutional: Negative for chills and fever.  Eyes: Negative for visual disturbance.  Respiratory: Negative for shortness of breath.   Cardiovascular: Negative for chest pain.  Gastrointestinal: Positive for nausea and vomiting. Negative for abdominal distention, abdominal pain, blood in stool, constipation and diarrhea.  Genitourinary: Negative for difficulty urinating and dysuria.  Musculoskeletal: Negative for back pain, myalgias and neck pain.  Skin: Negative for color change and rash.  Neurological: Negative for dizziness, syncope, light-headedness and headaches.  Psychiatric/Behavioral: Negative for confusion.    Physical Exam Updated Vital Signs BP 115/78 (BP Location: Right Arm)   Pulse 66   Temp 98.4 F (36.9 C) (Oral)   Resp 14   Ht 5' 7.5" (1.715 m)   Wt 67.1 kg   SpO2 100%   BMI 22.84 kg/m   Physical Exam Constitutional:      General: She is not in acute distress.    Appearance: She is not ill-appearing,  toxic-appearing or diaphoretic.  HENT:     Head: Normocephalic.     Mouth/Throat:     Mouth: Mucous membranes are dry.     Tongue: No lesions. Tongue does not deviate from midline.     Pharynx: Oropharynx is clear. Uvula midline. No pharyngeal swelling, oropharyngeal exudate, posterior oropharyngeal erythema or uvula swelling.     Tonsils: No tonsillar exudate or tonsillar abscesses.  Eyes:     Pupils: Pupils are equal, round, and reactive to light.  Cardiovascular:     Rate and Rhythm: Normal rate and regular rhythm.     Heart sounds: Normal heart sounds.  Pulmonary:     Effort: Pulmonary effort is normal.     Breath sounds: Normal breath sounds.  Abdominal:     General: Abdomen is flat. Bowel sounds are normal. There is no distension.     Palpations: Abdomen is soft. There is no mass or pulsatile mass.     Tenderness: There is no abdominal tenderness. There is no guarding or rebound.  Skin:    General: Skin is warm and dry.     Coloration: Skin is not jaundiced or pale.     Findings: No bruising or rash.  Neurological:     General: No focal deficit present.     Mental Status: She is alert.  Psychiatric:        Behavior: Behavior is cooperative.     ED Results / Procedures / Treatments   Labs (all labs ordered are listed, but only abnormal results are displayed) Labs Reviewed  COMPREHENSIVE METABOLIC PANEL - Abnormal; Notable for the following components:      Result Value   Glucose, Bld 102 (*)    All other components within normal limits  PREGNANCY, URINE  LIPASE, BLOOD    EKG None  Radiology No results found.  Procedures Procedures (including critical care time)  Medications Ordered in ED Medications  ondansetron (ZOFRAN) injection 4 mg (4 mg Intravenous Given 01/31/20 1728)  sodium chloride 0.9 % bolus 1,000 mL (0 mLs Intravenous Stopped 01/31/20 1826)    ED Course  I have reviewed the triage vital signs and the nursing notes.  Pertinent labs &  imaging results that were available during my care of the patient were reviewed by me and considered in my medical decision making (see chart for details).    MDM Rules/Calculators/A&P                          Alert 44 year old female in no acute distress.  Presents with persistent nausea and vomiting after drinking more alcoholic beverages Saturday night than usual.  Vomiting was controlled after taking Zofran at 11 AM this morning however nausea still persistent.  No recent traumatic injuries.  Patient denies abdominal pain.  Patient was given Zofran, and fluid bolus.  CMP, lipase and pregnancy test ordered to evaluate possible causes nausea vomiting.    CMP and lipase were unremarkable, no concern for pancreatitis, DKA, HHS, or AKI.  Patient test was negative.  Reports feeling better after receiving fluids and Zofran.  Serial abdominal exams with no signs of acute abdomen.. P.O. intake test passed by patient.  Vitals remained stable.  Discussed results, findings, treatment and follow up. Patient advised of return precautions. Patient verbalized understanding and agreed with plan.   Final Clinical Impression(s) / ED Diagnoses Final diagnoses:  Nausea    Rx / DC Orders ED Discharge Orders         Ordered    ondansetron (ZOFRAN) 4 MG tablet  Every 8 hours PRN        01/31/20 1848           Berneice Heinrich 01/31/20 1957    Pollyann Savoy, MD 01/31/20 2118

## 2020-01-31 NOTE — ED Triage Notes (Signed)
Pt. Did take zofran at 11am

## 2020-04-07 ENCOUNTER — Ambulatory Visit
Admission: RE | Admit: 2020-04-07 | Discharge: 2020-04-07 | Disposition: A | Discharge status: Home / Self Care | Payer: BC Managed Care – PPO | Source: Ambulatory Visit | Attending: Obstetrics and Gynecology | Admitting: Obstetrics and Gynecology

## 2020-04-07 DIAGNOSIS — Z9189 Other specified personal risk factors, not elsewhere classified: Secondary | ICD-10-CM

## 2020-04-07 MED ORDER — GADOBUTROL 1 MMOL/ML IV SOLN
7.0000 mL | Freq: Once | INTRAVENOUS | Status: AC | PRN
  Administered 2020-04-07: 7 mL via INTRAVENOUS

## 2020-07-25 ENCOUNTER — Encounter (HOSPITAL_BASED_OUTPATIENT_CLINIC_OR_DEPARTMENT_OTHER): Payer: Self-pay | Admitting: Emergency Medicine

## 2020-07-25 ENCOUNTER — Ambulatory Visit: Payer: Self-pay | Admitting: *Deleted

## 2020-07-25 ENCOUNTER — Emergency Department (HOSPITAL_BASED_OUTPATIENT_CLINIC_OR_DEPARTMENT_OTHER): Payer: BC Managed Care – PPO

## 2020-07-25 ENCOUNTER — Emergency Department (HOSPITAL_BASED_OUTPATIENT_CLINIC_OR_DEPARTMENT_OTHER)
Admission: EM | Admit: 2020-07-25 | Discharge: 2020-07-25 | Disposition: A | Discharge status: Home / Self Care | Payer: BC Managed Care – PPO | Attending: Emergency Medicine | Admitting: Emergency Medicine

## 2020-07-25 ENCOUNTER — Other Ambulatory Visit: Payer: Self-pay

## 2020-07-25 DIAGNOSIS — K852 Alcohol induced acute pancreatitis without necrosis or infection: Secondary | ICD-10-CM | POA: Insufficient documentation

## 2020-07-25 DIAGNOSIS — J45909 Unspecified asthma, uncomplicated: Secondary | ICD-10-CM | POA: Diagnosis not present

## 2020-07-25 DIAGNOSIS — R1110 Vomiting, unspecified: Secondary | ICD-10-CM | POA: Diagnosis present

## 2020-07-25 DIAGNOSIS — Z87891 Personal history of nicotine dependence: Secondary | ICD-10-CM | POA: Diagnosis not present

## 2020-07-25 LAB — URINALYSIS, ROUTINE W REFLEX MICROSCOPIC
Bilirubin Urine: NEGATIVE
Glucose, UA: NEGATIVE mg/dL
Hgb urine dipstick: NEGATIVE
Ketones, ur: >80 mg/dL — AB
Nitrite: NEGATIVE
Protein, ur: 30 mg/dL — AB
Specific Gravity, Urine: 1.036 — ABNORMAL HIGH (ref 1.005–1.030)
pH: 7 (ref 5.0–8.0)

## 2020-07-25 LAB — CBC WITH DIFFERENTIAL/PLATELET
Abs Immature Granulocytes: 0.03 10*3/uL (ref 0.00–0.07)
Basophils Absolute: 0.1 10*3/uL (ref 0.0–0.1)
Basophils Relative: 1 %
Eosinophils Absolute: 0 10*3/uL (ref 0.0–0.5)
Eosinophils Relative: 0 %
HCT: 41.2 % (ref 36.0–46.0)
Hemoglobin: 14.2 g/dL (ref 12.0–15.0)
Immature Granulocytes: 0 %
Lymphocytes Relative: 11 %
Lymphs Abs: 1.2 10*3/uL (ref 0.7–4.0)
MCH: 31.7 pg (ref 26.0–34.0)
MCHC: 34.5 g/dL (ref 30.0–36.0)
MCV: 92 fL (ref 80.0–100.0)
Monocytes Absolute: 0.4 10*3/uL (ref 0.1–1.0)
Monocytes Relative: 4 %
Neutro Abs: 8.8 10*3/uL — ABNORMAL HIGH (ref 1.7–7.7)
Neutrophils Relative %: 84 %
Platelets: 265 10*3/uL (ref 150–400)
RBC: 4.48 MIL/uL (ref 3.87–5.11)
RDW: 12.3 % (ref 11.5–15.5)
WBC: 10.6 10*3/uL — ABNORMAL HIGH (ref 4.0–10.5)
nRBC: 0 % (ref 0.0–0.2)

## 2020-07-25 LAB — COMPREHENSIVE METABOLIC PANEL
ALT: 11 U/L (ref 0–44)
AST: 14 U/L — ABNORMAL LOW (ref 15–41)
Albumin: 4.6 g/dL (ref 3.5–5.0)
Alkaline Phosphatase: 45 U/L (ref 38–126)
Anion gap: 11 (ref 5–15)
BUN: 13 mg/dL (ref 6–20)
CO2: 28 mmol/L (ref 22–32)
Calcium: 10.1 mg/dL (ref 8.9–10.3)
Chloride: 100 mmol/L (ref 98–111)
Creatinine, Ser: 0.83 mg/dL (ref 0.44–1.00)
GFR, Estimated: >60 mL/min (ref >60)
Glucose, Bld: 92 mg/dL (ref 70–99)
Potassium: 3.5 mmol/L (ref 3.5–5.1)
Sodium: 139 mmol/L (ref 135–145)
Total Bilirubin: 0.7 mg/dL (ref 0.3–1.2)
Total Protein: 7.5 g/dL (ref 6.5–8.1)

## 2020-07-25 LAB — PREGNANCY, URINE: Preg Test, Ur: NEGATIVE

## 2020-07-25 LAB — LIPASE, BLOOD: Lipase: 103 U/L — ABNORMAL HIGH (ref 11–51)

## 2020-07-25 MED ORDER — ONDANSETRON HCL 4 MG/2ML IJ SOLN
4.0000 mg | Freq: Once | INTRAMUSCULAR | Status: AC
  Administered 2020-07-25: 4 mg via INTRAVENOUS

## 2020-07-25 MED ORDER — SODIUM CHLORIDE 0.9 % IV SOLN: INTRAVENOUS | Status: DC

## 2020-07-25 MED ORDER — SODIUM CHLORIDE 0.9 % IV BOLUS
1000.0000 mL | Freq: Once | INTRAVENOUS | Status: AC
  Administered 2020-07-25: 1000 mL via INTRAVENOUS

## 2020-07-25 MED ORDER — PANTOPRAZOLE SODIUM 40 MG IV SOLR
40.0000 mg | Freq: Once | INTRAVENOUS | Status: AC
  Administered 2020-07-25: 40 mg via INTRAVENOUS
  Filled 2020-07-25: qty 40

## 2020-07-25 MED ORDER — ONDANSETRON HCL 4 MG/2ML IJ SOLN
4.0000 mg | Freq: Once | INTRAMUSCULAR | Status: DC
  Filled 2020-07-25: qty 2

## 2020-07-25 MED ORDER — ONDANSETRON 4 MG PO TBDP: 4.0000 mg | ORAL_TABLET | Freq: Three times a day (TID) | ORAL | 0 refills | Status: DC | PRN

## 2020-07-25 NOTE — ED Triage Notes (Signed)
Emesis x 2 days , mid abd pain. sts had 6 alcoholic drinks night before her symptoms.

## 2020-07-25 NOTE — ED Notes (Signed)
States drank 5 to 6 ETOH drinks on Saturday night and been having nausea and vomiting since.  Vomited  Over 5 times yesterday; once today.  Denies abdominal pain.

## 2020-07-25 NOTE — ED Provider Notes (Signed)
[facility]   CSN: [id number] Arrival date & time: 07/25/20  1024     History Chief Complaint  Patient presents with  . Emesis    Alyssa Martinez is a 45 y.o. female.  Patient on Saturday he was at a party had 5-6 alcoholic drinks.  On Sunday started with abdominal pain and and vomiting and that was actually in the afternoon so it was somewhat delayed.  Something similar happened associated with a Christmas party.  Patient denies vomiting of any blood.  Only vomited once today vomited over 5 times yesterday.  Abdominal pain is now resolved.        Past Medical History:  Diagnosis Date  . AMA (advanced maternal age) multigravida 35+   . Anemia   . Asthma   . Chronic constipation   . Hemorrhoid   . Hx of varicella   . Irritable bowel syndrome   . Pneumonia    Mid teens to mid 20's    Patient Active Problem List   Diagnosis Date Noted  . Chronic constipation 10/29/2013  . External hemorrhoids 12/01/2012  . Dyspnea 10/02/2012  . Sinus bradycardia 10/02/2012  . 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 BREATH TEK H PYLORI N/A 05/05/2014   Procedure: BREATH TEK H PYLORI;  Surgeon: Beverley FiedlerJay M Pyrtle, MD;  [facility];  Service: Gastroenterology;  Laterality: N/A;  . WISDOM TOOTH EXTRACTION       OB History    Gravida  2   Para  2   Term  2   Preterm      AB      Living  2     SAB      IAB      Ectopic      Multiple      Live Births  2           Family History  Problem Relation Age of Onset  . Hypertension Mother   . Thyroid disease Mother   . Breast cancer Mother   . Urolithiasis Father   . Urolithiasis Brother   . Breast cancer Maternal Grandmother   . Breast cancer Paternal Grandmother   . Heart Problems Paternal Grandfather     Social History   Tobacco Use  . Smoking status: Former Smoker    Types: Cigarettes    Quit  date: 02/19/2000    Years since quitting: 20.4  . Smokeless tobacco: Never Used  Substance Use Topics  . Alcohol use: Yes    Comment: Occassionally  . Drug use: No    Home Medications Prior to Admission medications   Medication Sig Start Date End Date Taking? Authorizing Provider  ondansetron (ZOFRAN ODT) 4 MG disintegrating tablet Take 1 tablet (4 mg total) by mouth every 8 (eight) hours as needed for nausea or vomiting. 07/25/20  Yes Vanetta MuldersZackowski, Davieon Stockham, MD  albuterol (PROVENTIL HFA;VENTOLIN HFA) 108 (90 BASE) MCG/ACT inhaler Inhale 2 puffs into the lungs every 6 (six) hours as needed for wheezing.    [provider]  calcium carbonate (TUMS - DOSED IN MG ELEMENTAL CALCIUM) 500 MG chewable tablet Chew 1 tablet by mouth as needed for indigestion or heartburn.    [provider]  docusate sodium (COLACE) 100 MG capsule Take 100 mg by mouth 2 (two) times daily.    [provider]  hydrocortisone (ANUSOL-HC) 2.5 % rectal cream Place rectally 2 (two) times daily. Apply  around anus for irritated & painful hemorrhoids Patient taking differently: Place rectally as needed. Apply around anus for irritated & painful hemorrhoids 12/01/12   Romie Levee, MD  hydrocortisone-pramoxine [facility]) 2.5-1 % rectal cream Place 1 application rectally 3 (three) times daily. Use a small amount 3-4 times daily. Patient taking differently: Place 1 application rectally as needed. Use a small amount 3-4 times daily. 10/29/13   Esterwood, Amy S, PA-C  ibuprofen (ADVIL,MOTRIN) 100 MG tablet Take 100 mg by mouth every 6 (six) hours as needed for fever.    [provider]  levocetirizine (XYZAL) 5 MG tablet Take 5 mg by mouth every evening.    [provider]  Multiple Vitamins-Minerals (MULTIVITAL PO) Take 1 tablet by mouth daily.    [provider]  ondansetron (ZOFRAN) 4 MG tablet Take 1 tablet (4 mg total) by mouth every 8 (eight) hours as needed for nausea or  vomiting. 01/31/20   Haskel Schroeder, PA-C  ondansetron (ZOFRAN-ODT) 4 MG disintegrating tablet Take 1 tablet (4 mg total) by mouth every 8 (eight) hours as needed for nausea or vomiting (take 1 to 2 tabs every 8 hours as needed). 01/30/20   Armbruster, Willaim Rayas, MD  linaclotide [facility]) 72 MCG capsule Take 1 capsule (72 mcg total) by mouth daily before breakfast. 07/18/15 09/19/19  Pyrtle, Carie Caddy, MD  loratadine (CLARITIN) 10 MG tablet Take 10 mg by mouth daily as needed for allergies.   09/19/19  [provider]  ranitidine (ZANTAC) 150 MG tablet Take 150 mg by mouth as needed for heartburn.  09/19/19  [provider]    Allergies    Patient has no known allergies.  Review of Systems   Review of Systems  Constitutional: Negative for chills and fever.  HENT: Negative for rhinorrhea and sore throat.   Eyes: Negative for visual disturbance.  Respiratory: Negative for cough and shortness of breath.   Cardiovascular: Negative for chest pain and leg swelling.  Gastrointestinal: Positive for abdominal pain, nausea and vomiting. Negative for diarrhea.  Genitourinary: Negative for dysuria.  Musculoskeletal: Negative for back pain and neck pain.  Skin: Negative for rash.  Neurological: Negative for dizziness, light-headedness and headaches.  Hematological: Does not bruise/bleed easily.  Psychiatric/Behavioral: Negative for confusion.    Physical Exam Updated Vital Signs BP 115/81 (BP Location: Right Arm)   Pulse 70   Temp 97.8 F (36.6 C) (Oral)   Resp (!) 8   Ht 1.715 m (5' 7.5")   Wt 67.6 kg   SpO2 100%   BMI 22.99 kg/m   Physical Exam Vitals and nursing note reviewed.  Constitutional:      General: She is not in acute distress.    Appearance: Normal appearance. She is well-developed.  HENT:     Head: Normocephalic and atraumatic.  Eyes:     Extraocular Movements: Extraocular movements intact.     Conjunctiva/sclera: Conjunctivae normal.     Pupils: Pupils  are equal, round, and reactive to light.  Cardiovascular:     Rate and Rhythm: Normal rate and regular rhythm.     Heart sounds: No murmur heard.   Pulmonary:     Effort: Pulmonary effort is normal. No respiratory distress.     Breath sounds: Normal breath sounds.  Abdominal:     General: There is no distension.     Palpations: Abdomen is soft.     Tenderness: There is no abdominal tenderness. There is no guarding.  Musculoskeletal:  General: No swelling. Normal range of motion.     Cervical back: Normal range of motion and neck supple.  Skin:    General: Skin is warm and dry.     Capillary Refill: Capillary refill takes less than 2 seconds.  Neurological:     General: No focal deficit present.     Mental Status: She is alert and oriented to person, place, and time.     Cranial Nerves: No cranial nerve deficit.     Sensory: No sensory deficit.     Motor: No weakness.     ED Results / Procedures / Treatments   Labs (all labs ordered are listed, but only abnormal results are displayed) Labs Reviewed  COMPREHENSIVE METABOLIC PANEL - Abnormal; Notable for the following components:      Result Value   AST 14 (*)    All other components within normal limits  LIPASE, BLOOD - Abnormal; Notable for the following components:   Lipase 103 (*)    All other components within normal limits  CBC WITH DIFFERENTIAL/PLATELET - Abnormal; Notable for the following components:   WBC 10.6 (*)    Neutro Abs 8.8 (*)    All other components within normal limits  URINALYSIS, ROUTINE W REFLEX MICROSCOPIC - Abnormal; Notable for the following components:   Specific Gravity, Urine 1.036 (*)    Ketones, ur >80 (*)    Protein, ur 30 (*)    Leukocytes,Ua TRACE (*)    Bacteria, UA FEW (*)    All other components within normal limits  PREGNANCY, URINE    EKG None  Radiology CT Abdomen Pelvis Wo Contrast  Result Date: 07/25/2020 CLINICAL DATA:  Epigastric abdominal pain. EXAM: CT ABDOMEN  AND PELVIS WITHOUT CONTRAST TECHNIQUE: Multidetector CT imaging of the abdomen and pelvis was performed following the standard protocol without IV contrast. COMPARISON:  November 05, 2012. FINDINGS: Lower chest: No acute abnormality. Hepatobiliary: No focal liver abnormality is seen. No gallstones, gallbladder wall thickening, or biliary dilatation. Pancreas: Unremarkable. No pancreatic ductal dilatation or surrounding inflammatory changes. Spleen: Normal in size without focal abnormality. Adrenals/Urinary Tract: Adrenal glands appear normal. Bilateral nonobstructive nephrolithiasis is noted. No hydronephrosis or renal obstruction is noted. Urinary bladder is unremarkable. Stomach/Bowel: Stomach is within normal limits. Appendix appears normal. No evidence of bowel wall thickening, distention, or inflammatory changes. Vascular/Lymphatic: No significant vascular findings are present. No enlarged abdominal or pelvic lymph nodes. Reproductive: Intrauterine device is noted. No adnexal abnormality is noted. Other: No abdominal wall hernia or abnormality. No abdominopelvic ascites. Musculoskeletal: No acute or significant osseous findings. IMPRESSION: Bilateral nonobstructive nephrolithiasis. No hydronephrosis or renal obstruction is noted. No other abnormality seen in the abdomen or pelvis. Electronically Signed   By: Lupita Raider M.D.   On: 07/25/2020 12:24    Procedures Procedures   Medications Ordered in ED Medications  0.9 %  sodium chloride infusion ( Intravenous New Bag/Given 07/25/20 1433)  ondansetron (ZOFRAN) injection 4 mg (has no administration in time range)  sodium chloride 0.9 % bolus 1,000 mL (0 mLs Intravenous Stopped 07/25/20 1255)  ondansetron (ZOFRAN) injection 4 mg (4 mg Intravenous Given 07/25/20 1145)  pantoprazole (PROTONIX) injection 40 mg (40 mg Intravenous Given 07/25/20 1145)  sodium chloride 0.9 % bolus 1,000 mL (0 mLs Intravenous Stopped 07/25/20 1433)    ED Course  I have reviewed  the triage vital signs and the nursing notes.  Pertinent labs & imaging results that were available during my care of the patient were  reviewed by me and considered in my medical decision making (see chart for details).    MDM Rules/Calculators/A&P                          Labs shows elevated lipase.  CT scan of the abdomen without any abnormalities.  Liver function test normal today.  No anion gap.  Renal functions normal.  Mild leukocytosis.  Patient feeling much better following some IV Protonix and IV fluids.  Feel patient is good for discharge home with clear liquids for the next 24 hours then advancing to bland diet.  And antinausea medicine.  Do not feel patient needs admission.  CT scan showed no complicating factors associated with the pancreatitis.  And actually actually did not show any inflammation of the pancreas.   Final Clinical Impression(s) / ED Diagnoses Final diagnoses:  Alcohol-induced acute pancreatitis without infection or necrosis    Rx / DC Orders ED Discharge Orders         Ordered    ondansetron (ZOFRAN ODT) 4 MG disintegrating tablet  Every 8 hours PRN        07/25/20 1526           Vanetta Mulders, MD 07/25/20 1530

## 2020-07-25 NOTE — Discharge Instructions (Signed)
Take the Zofran as needed to control the nausea and vomiting.  Recommend clear liquids at least for the next 24 hours you can do liquids with sugar.  And then advance to bland diet.  Return for any new or worse symptoms.  Make an appointment to follow-up with your primary care doctor.

## 2020-07-25 NOTE — Telephone Encounter (Signed)
  Pt called in on the PEC line.   She had several alcoholic drinks on Saturday.   Started vomiting on Sunday around 3:00 PM and hasn't been able to stop.   Can't keep any food or fluids down.   This happened to her once before when she drank too much alcohol and ended up in the ED getting IV fluids per pt.  I have referred her to the ED since she can't keep anything down including water since Sun.   She tried to contact her PCP but did not hear back from them.  She was agreeable to going to the ED.   Reason for Disposition . [1] Drinking very little AND [2] dehydration suspected (e.g., no urine > 12 hours, very dry mouth, very lightheaded)    Drinking alcohol on Sat.  Sun. Started vomiting can't keep anything down.  Answer Assessment - Initial Assessment Questions 1. VOMITING SEVERITY: "How many times have you vomited in the past 24 hours?"     - MILD:  1 - 2 times/day    - MODERATE: 3 - 5 times/day, decreased oral intake without significant weight loss or symptoms of dehydration    - SEVERE: 6 or more times/day, vomits everything or nearly everything, with significant weight loss, symptoms of dehydration      I had 5 drinks over 6 hours Sat. Alcoholic drinks.    Sunday at 3:00 I started vomiting and can't stop.  I can't keep anything down.    This happened to me once before when I drank took many drinks with alcohol in them and ended up in the ED getting IV fluids. 2. ONSET: "When did the vomiting begin?"      Sunday at 3:00 PM. 3. FLUIDS: "What fluids or food have you vomited up today?" "Have you been able to keep any fluids down?"     Not been able to keep any fluids down.  Have not tried to eat.  Having a lot of nausea.   Took a Zofran that helped some but still vomiting and having nausea since Sunday. 4. ABDOMINAL PAIN: "Are your having any abdominal pain?" If yes : "How bad is it and what does it feel like?" (e.g., crampy, dull, intermittent, constant)      No   Just nausea and  vomiting 5. DIARRHEA: "Is there any diarrhea?" If Yes, ask: "How many times today?"      No diarrhea 6. CONTACTS: "Is there anyone else in the family with the same symptoms?"      No sick contacts 7. CAUSE: "What do you think is causing your vomiting?"     Drinking too much alcohol at an event on Saturday. 8. HYDRATION STATUS: "Any signs of dehydration?" (e.g., dry mouth [not only dry lips], too weak to stand) "When did you last urinate?"     Yes.  Very dry mouth, not urinating as much though urine color has not changed or become darker. 9. OTHER SYMPTOMS: "Do you have any other symptoms?" (e.g., fever, headache, vertigo, vomiting blood or coffee grounds, recent head injury)     No injuries or accidents.   No blood seen in vomitus. 10. PREGNANCY: "Is there any chance you are pregnant?" "When was your last menstrual period?"       Not asked  Protocols used: [facility]

## 2020-10-24 ENCOUNTER — Other Ambulatory Visit: Payer: Self-pay | Admitting: Obstetrics and Gynecology

## 2020-10-24 DIAGNOSIS — Z803 Family history of malignant neoplasm of breast: Secondary | ICD-10-CM

## 2020-10-24 DIAGNOSIS — R928 Other abnormal and inconclusive findings on diagnostic imaging of breast: Secondary | ICD-10-CM

## 2020-11-10 ENCOUNTER — Ambulatory Visit
Admission: RE | Admit: 2020-11-10 | Discharge: 2020-11-10 | Disposition: A | Discharge status: Home / Self Care | Payer: BC Managed Care – PPO | Source: Ambulatory Visit | Attending: Obstetrics and Gynecology | Admitting: Obstetrics and Gynecology

## 2020-11-10 ENCOUNTER — Other Ambulatory Visit: Payer: Self-pay | Admitting: Obstetrics and Gynecology

## 2020-11-10 ENCOUNTER — Other Ambulatory Visit: Payer: Self-pay

## 2020-11-10 DIAGNOSIS — R928 Other abnormal and inconclusive findings on diagnostic imaging of breast: Secondary | ICD-10-CM

## 2020-11-20 ENCOUNTER — Other Ambulatory Visit: Payer: Self-pay | Admitting: Obstetrics and Gynecology

## 2020-11-20 DIAGNOSIS — N6001 Solitary cyst of right breast: Secondary | ICD-10-CM

## 2020-12-12 ENCOUNTER — Other Ambulatory Visit: Payer: Self-pay | Admitting: Obstetrics and Gynecology

## 2020-12-12 DIAGNOSIS — N6001 Solitary cyst of right breast: Secondary | ICD-10-CM

## 2020-12-19 HISTORY — PX: BREAST BIOPSY: SHX20

## 2020-12-26 ENCOUNTER — Ambulatory Visit
Admission: RE | Admit: 2020-12-26 | Discharge: 2020-12-26 | Disposition: A | Discharge status: Home / Self Care | Payer: BC Managed Care – PPO | Source: Ambulatory Visit | Attending: Obstetrics and Gynecology | Admitting: Obstetrics and Gynecology

## 2020-12-26 ENCOUNTER — Other Ambulatory Visit: Payer: Self-pay

## 2020-12-26 DIAGNOSIS — N6001 Solitary cyst of right breast: Secondary | ICD-10-CM

## 2021-03-23 ENCOUNTER — Other Ambulatory Visit: Payer: Self-pay

## 2021-03-23 ENCOUNTER — Ambulatory Visit
Admission: RE | Admit: 2021-03-23 | Discharge: 2021-03-23 | Disposition: A | Discharge status: Home / Self Care | Payer: BC Managed Care – PPO | Source: Ambulatory Visit | Attending: Obstetrics and Gynecology | Admitting: Obstetrics and Gynecology

## 2021-03-23 DIAGNOSIS — Z803 Family history of malignant neoplasm of breast: Secondary | ICD-10-CM

## 2021-03-23 MED ORDER — GADOBUTROL 1 MMOL/ML IV SOLN
7.0000 mL | Freq: Once | INTRAVENOUS | Status: AC | PRN
  Administered 2021-03-23: 7 mL via INTRAVENOUS

## 2021-05-15 ENCOUNTER — Other Ambulatory Visit: Payer: BC Managed Care – PPO

## 2022-01-01 ENCOUNTER — Other Ambulatory Visit: Payer: Self-pay | Admitting: Obstetrics and Gynecology

## 2022-01-01 DIAGNOSIS — Z1239 Encounter for other screening for malignant neoplasm of breast: Secondary | ICD-10-CM

## 2022-07-08 ENCOUNTER — Ambulatory Visit
Admission: RE | Admit: 2022-07-08 | Discharge: 2022-07-08 | Disposition: A | Discharge status: Home / Self Care | Payer: BC Managed Care – PPO | Source: Ambulatory Visit | Attending: Obstetrics and Gynecology | Admitting: Obstetrics and Gynecology

## 2022-07-08 DIAGNOSIS — Z1239 Encounter for other screening for malignant neoplasm of breast: Secondary | ICD-10-CM

## 2022-07-08 MED ORDER — GADOPICLENOL 0.5 MMOL/ML IV SOLN
7.5000 mL | Freq: Once | INTRAVENOUS | Status: AC | PRN
  Administered 2022-07-08: 7.5 mL via INTRAVENOUS

## 2022-07-10 ENCOUNTER — Other Ambulatory Visit: Payer: Self-pay | Admitting: Obstetrics and Gynecology

## 2022-07-10 DIAGNOSIS — R928 Other abnormal and inconclusive findings on diagnostic imaging of breast: Secondary | ICD-10-CM

## 2022-07-30 ENCOUNTER — Other Ambulatory Visit: Payer: Self-pay

## 2022-07-30 ENCOUNTER — Emergency Department (HOSPITAL_BASED_OUTPATIENT_CLINIC_OR_DEPARTMENT_OTHER)
Admission: EM | Admit: 2022-07-30 | Discharge: 2022-07-31 | Disposition: A | Discharge status: Home / Self Care | Payer: BC Managed Care – PPO | Attending: Emergency Medicine | Admitting: Emergency Medicine

## 2022-07-30 ENCOUNTER — Emergency Department (HOSPITAL_BASED_OUTPATIENT_CLINIC_OR_DEPARTMENT_OTHER): Payer: BC Managed Care – PPO

## 2022-07-30 ENCOUNTER — Encounter (HOSPITAL_BASED_OUTPATIENT_CLINIC_OR_DEPARTMENT_OTHER): Payer: Self-pay

## 2022-07-30 DIAGNOSIS — R1013 Epigastric pain: Secondary | ICD-10-CM | POA: Diagnosis not present

## 2022-07-30 DIAGNOSIS — E860 Dehydration: Secondary | ICD-10-CM | POA: Insufficient documentation

## 2022-07-30 DIAGNOSIS — D72829 Elevated white blood cell count, unspecified: Secondary | ICD-10-CM | POA: Diagnosis not present

## 2022-07-30 DIAGNOSIS — R112 Nausea with vomiting, unspecified: Secondary | ICD-10-CM | POA: Diagnosis present

## 2022-07-30 LAB — COMPREHENSIVE METABOLIC PANEL
ALT: 11 U/L (ref 0–44)
AST: 15 U/L (ref 15–41)
Albumin: 4.7 g/dL (ref 3.5–5.0)
Alkaline Phosphatase: 50 U/L (ref 38–126)
Anion gap: 14 (ref 5–15)
BUN: 12 mg/dL (ref 6–20)
CO2: 21 mmol/L — ABNORMAL LOW (ref 22–32)
Calcium: 9.8 mg/dL (ref 8.9–10.3)
Chloride: 103 mmol/L (ref 98–111)
Creatinine, Ser: 0.87 mg/dL (ref 0.44–1.00)
GFR, Estimated: >60 mL/min (ref >60)
Glucose, Bld: 148 mg/dL — ABNORMAL HIGH (ref 70–99)
Potassium: 3.7 mmol/L (ref 3.5–5.1)
Sodium: 138 mmol/L (ref 135–145)
Total Bilirubin: 0.9 mg/dL (ref 0.3–1.2)
Total Protein: 7.6 g/dL (ref 6.5–8.1)

## 2022-07-30 LAB — LIPASE, BLOOD: Lipase: 31 U/L (ref 11–51)

## 2022-07-30 LAB — CBC WITH DIFFERENTIAL/PLATELET
Abs Immature Granulocytes: 0.04 10*3/uL (ref 0.00–0.07)
Basophils Absolute: 0.1 10*3/uL (ref 0.0–0.1)
Basophils Relative: 1 %
Eosinophils Absolute: 0 10*3/uL (ref 0.0–0.5)
Eosinophils Relative: 0 %
HCT: 40.5 % (ref 36.0–46.0)
Hemoglobin: 14.6 g/dL (ref 12.0–15.0)
Immature Granulocytes: 0 %
Lymphocytes Relative: 17 %
Lymphs Abs: 1.9 10*3/uL (ref 0.7–4.0)
MCH: 31.7 pg (ref 26.0–34.0)
MCHC: 36 g/dL (ref 30.0–36.0)
MCV: 87.9 fL (ref 80.0–100.0)
Monocytes Absolute: 0.5 10*3/uL (ref 0.1–1.0)
Monocytes Relative: 5 %
Neutro Abs: 8.7 10*3/uL — ABNORMAL HIGH (ref 1.7–7.7)
Neutrophils Relative %: 77 %
Platelets: 293 10*3/uL (ref 150–400)
RBC: 4.61 MIL/uL (ref 3.87–5.11)
RDW: 12.3 % (ref 11.5–15.5)
WBC: 11.2 10*3/uL — ABNORMAL HIGH (ref 4.0–10.5)
nRBC: 0 % (ref 0.0–0.2)

## 2022-07-30 LAB — HCG, QUANTITATIVE, PREGNANCY: hCG, Beta Chain, Quant, S: <1 m[IU]/mL (ref <5)

## 2022-07-30 MED ORDER — MORPHINE SULFATE (PF) 4 MG/ML IV SOLN
4.0000 mg | Freq: Once | INTRAVENOUS | Status: AC
  Administered 2022-07-30: 4 mg via INTRAVENOUS
  Filled 2022-07-30: qty 1

## 2022-07-30 MED ORDER — DIPHENHYDRAMINE HCL 50 MG/ML IJ SOLN
25.0000 mg | Freq: Once | INTRAMUSCULAR | Status: AC
  Administered 2022-07-30: 25 mg via INTRAVENOUS
  Filled 2022-07-30: qty 1

## 2022-07-30 MED ORDER — IOHEXOL 300 MG/ML  SOLN
100.0000 mL | Freq: Once | INTRAMUSCULAR | Status: AC | PRN
  Administered 2022-07-30: 80 mL via INTRAVENOUS

## 2022-07-30 MED ORDER — SODIUM CHLORIDE 0.9 % IV BOLUS
1000.0000 mL | Freq: Once | INTRAVENOUS | Status: AC
  Administered 2022-07-30: 1000 mL via INTRAVENOUS

## 2022-07-30 MED ORDER — METOCLOPRAMIDE HCL 5 MG/ML IJ SOLN
10.0000 mg | Freq: Once | INTRAMUSCULAR | Status: AC
  Administered 2022-07-30: 10 mg via INTRAVENOUS
  Filled 2022-07-30: qty 2

## 2022-07-30 MED ORDER — ONDANSETRON HCL 4 MG/2ML IJ SOLN
4.0000 mg | Freq: Once | INTRAMUSCULAR | Status: AC
  Administered 2022-07-30: 4 mg via INTRAVENOUS
  Filled 2022-07-30: qty 2

## 2022-07-30 NOTE — ED Notes (Signed)
Pt. To CT via stretcher. 

## 2022-07-30 NOTE — ED Triage Notes (Signed)
Pt complaining of feeling weak and vomiting today. Said it started this am with feeling weak, not herself, had been in bed all day. Started vomiting an hour ago.

## 2022-07-30 NOTE — ED Provider Notes (Signed)
[place] EMERGENCY DEPARTMENT AT [facility] Provider Note   CSN: [id number] Arrival date & time: 07/30/22  2140     History  Chief Complaint  Patient presents with   Emesis    Alyssa Martinez is a 47 y.o. female otherwise healthy here presenting with vomiting and dehydration.  Patient states that she went to a pool party yesterday.  She had some chips and dips and other food there.  She states that she was feeling fine until midnight yesterday.  She was nauseated.  She states that this evening she started having bilious vomiting.  Patient was unable to keep anything down since then.  Patient has no previous abdominal surgeries in the past  The history is provided by the patient and the spouse.       Home Medications Prior to Admission medications   Medication Sig Start Date End Date Taking? Authorizing Provider  albuterol (PROVENTIL HFA;VENTOLIN HFA) 108 (90 BASE) MCG/ACT inhaler Inhale 2 puffs into the lungs every 6 (six) hours as needed for wheezing.    [provider]  calcium carbonate (TUMS - DOSED IN MG ELEMENTAL CALCIUM) 500 MG chewable tablet Chew 1 tablet by mouth as needed for indigestion or heartburn.    [provider]  docusate sodium (COLACE) 100 MG capsule Take 100 mg by mouth 2 (two) times daily.    [provider]  hydrocortisone (ANUSOL-HC) 2.5 % rectal cream Place rectally 2 (two) times daily. Apply around anus for irritated & painful hemorrhoids Patient taking differently: Place rectally as needed. Apply around anus for irritated & painful hemorrhoids 12/01/12   Romie Levee, MD  hydrocortisone-pramoxine [facility]) 2.5-1 % rectal cream Place 1 application rectally 3 (three) times daily. Use a small amount 3-4 times daily. Patient taking differently: Place 1 application rectally as needed. Use a small amount 3-4 times daily. 10/29/13   Esterwood, Amy S, PA-C  ibuprofen (ADVIL,MOTRIN) 100 MG tablet Take 100 mg by mouth  every 6 (six) hours as needed for fever.    [provider]  levocetirizine (XYZAL) 5 MG tablet Take 5 mg by mouth every evening.    [provider]  Multiple Vitamins-Minerals (MULTIVITAL PO) Take 1 tablet by mouth daily.    [provider]  ondansetron (ZOFRAN ODT) 4 MG disintegrating tablet Take 1 tablet (4 mg total) by mouth every 8 (eight) hours as needed for nausea or vomiting. 07/25/20   Vanetta Mulders, MD  ondansetron (ZOFRAN) 4 MG tablet Take 1 tablet (4 mg total) by mouth every 8 (eight) hours as needed for nausea or vomiting. 01/31/20   Haskel Schroeder, PA-C  ondansetron (ZOFRAN-ODT) 4 MG disintegrating tablet Take 1 tablet (4 mg total) by mouth every 8 (eight) hours as needed for nausea or vomiting (take 1 to 2 tabs every 8 hours as needed). 01/30/20   Armbruster, Willaim Rayas, MD  linaclotide [facility] Einstein) 72 MCG capsule Take 1 capsule (72 mcg total) by mouth daily before breakfast. 07/18/15 09/19/19  Pyrtle, Carie Caddy, MD  loratadine (CLARITIN) 10 MG tablet Take 10 mg by mouth daily as needed for allergies.   09/19/19  [provider]  ranitidine (ZANTAC) 150 MG tablet Take 150 mg by mouth as needed for heartburn.  09/19/19  [provider]      Allergies    Patient has no known allergies.    Review of Systems   Review of Systems  Gastrointestinal:  Positive for vomiting.  All other systems reviewed and are negative.  Physical Exam Updated Vital Signs BP (!) 117/102 (BP Location: Right Arm)   Pulse 87   Temp (!) 97.4 F (36.3 C) (Axillary)   Resp (!) 24   Ht 5\' 7"  (1.702 m)   Wt 67.6 kg   SpO2 100%   BMI 23.34 kg/m  Physical Exam Vitals and nursing note reviewed.  Constitutional:      Comments: Vomiting and dehydrated  HENT:     Head: Normocephalic.     Nose: Nose normal.     Mouth/Throat:     Mouth: Mucous membranes are dry.  Eyes:     Extraocular Movements: Extraocular movements intact.     Pupils: Pupils are equal, round, and  reactive to light.  Cardiovascular:     Rate and Rhythm: Normal rate and regular rhythm.     Pulses: Normal pulses.     Heart sounds: Normal heart sounds.  Pulmonary:     Effort: Pulmonary effort is normal.     Breath sounds: Normal breath sounds.  Abdominal:     Comments: + epigastric tenderness   Musculoskeletal:        General: Normal range of motion.     Cervical back: Normal range of motion and neck supple.  Skin:    General: Skin is warm.     Capillary Refill: Capillary refill takes less than 2 seconds.  Neurological:     General: No focal deficit present.  Psychiatric:        Mood and Affect: Mood normal.     ED Results / Procedures / Treatments   Labs (all labs ordered are listed, but only abnormal results are displayed) Labs Reviewed  CBC WITH DIFFERENTIAL/PLATELET - Abnormal; Notable for the following components:      Result Value   WBC 11.2 (*)    Neutro Abs 8.7 (*)    All other components within normal limits  COMPREHENSIVE METABOLIC PANEL - Abnormal; Notable for the following components:   CO2 21 (*)    Glucose, Bld 148 (*)    All other components within normal limits  LIPASE, BLOOD  HCG, QUANTITATIVE, PREGNANCY  URINALYSIS, ROUTINE W REFLEX MICROSCOPIC  PREGNANCY, URINE    EKG None  Radiology No results found.  Procedures Procedures    Medications Ordered in ED Medications  sodium chloride 0.9 % bolus 1,000 mL (0 mLs Intravenous Stopped 07/30/22 2318)  ondansetron (ZOFRAN) injection 4 mg (4 mg Intravenous Given 07/30/22 2217)  metoCLOPramide (REGLAN) injection 10 mg (10 mg Intravenous Given 07/30/22 2238)  diphenhydrAMINE (BENADRYL) injection 25 mg (25 mg Intravenous Given 07/30/22 2237)  morphine (PF) 4 MG/ML injection 4 mg (4 mg Intravenous Given 07/30/22 2238)  iohexol (OMNIPAQUE) 300 MG/ML solution 100 mL (80 mLs Intravenous Contrast Given 07/30/22 2320)    ED Course/ Medical Decision Making/ A&P                             Medical  Decision Making Alyssa Martinez is a 47 y.o. female here presenting with vomiting and epigastric pain.  I think likely food poisoning versus viral gastroenteritis versus small bowel obstruction.  Plan to get CBC and CMP and lipase and CT abdomen pelvis.  Will hydrate and reassess  11:31 PM Reviewed patient's labs and white blood cell count is 11.  Bicarb is 21.  CT abdomen pelvis is pending.  Signed out to Dr. Judd Lien to follow-up CT and reassess patient   Amount and/or Complexity of  Data Reviewed Labs: ordered. Radiology: ordered.  Risk Prescription drug management.    Final Clinical Impression(s) / ED Diagnoses Final diagnoses:  None    Rx / DC Orders ED Discharge Orders     None         Charlynne Pander, MD 07/30/22 [phone]

## 2022-07-31 ENCOUNTER — Other Ambulatory Visit: Payer: BC Managed Care – PPO

## 2022-07-31 MED ORDER — ONDANSETRON 8 MG PO TBDP: ORAL_TABLET | ORAL | 0 refills | Status: AC

## 2022-07-31 NOTE — ED Provider Notes (Signed)
  Physical Exam  BP (!) 117/102 (BP Location: Right Arm)   Pulse 87   Temp (!) 97.4 F (36.3 C) (Axillary)   Resp (!) 24   Ht 5\' 7"  (1.702 m)   Wt 67.6 kg   SpO2 100%   BMI 23.34 kg/m   Physical Exam Constitutional:      Appearance: Normal appearance.  HENT:     Head: Normocephalic and atraumatic.  Pulmonary:     Effort: Pulmonary effort is normal.  Skin:    General: Skin is warm and dry.  Neurological:     Mental Status: She is alert and oriented to person, place, and time.     Procedures  Procedures  ED Course / MDM    Medical Decision Making Amount and/or Complexity of Data Reviewed Labs: ordered. Radiology: ordered.  Risk Prescription drug management.   Care assumed from Dr. Silverio Lay at shift change.  Patient signed out to me awaiting results of the CT scan.  She presents here with nausea, vomiting, and abdominal cramping.  She has received medications here in the ER and is now feeling significantly improved.  CT has returned and shows no acute intra-abdominal process.  There is a 4 cm lesion to the right ovary for which an ultrasound is recommended.  I offered to obtain that this evening, however patient is tired and would like to go home.  She has a GYN that she sees and will follow-up with them to obtain this study.  Patient to be discharged with Zofran and as needed return.  Symptoms likely either viral or related to a foodborne illness, either way I believed to be self-limited.       Geoffery Lyons, MD 07/31/22 [phone]

## 2022-07-31 NOTE — Discharge Instructions (Signed)
Begin taking Zofran as prescribed as needed for nausea.  Clear liquids for the next 12 hours, then advance diet to normal as tolerated.  Follow-up with your GYN regarding your CAT scan.  There is a cystic lesion noted to your right ovary for which the radiologist is recommending an ultrasound.  Return to the ER if you develop severe abdominal pain, high fevers, bloody stools, or for other new and concerning symptoms.

## 2022-08-07 ENCOUNTER — Ambulatory Visit
Admission: RE | Admit: 2022-08-07 | Discharge: 2022-08-07 | Disposition: A | Discharge status: Home / Self Care | Payer: BC Managed Care – PPO | Source: Ambulatory Visit | Attending: Obstetrics and Gynecology | Admitting: Obstetrics and Gynecology

## 2022-08-07 DIAGNOSIS — R928 Other abnormal and inconclusive findings on diagnostic imaging of breast: Secondary | ICD-10-CM

## 2022-08-07 HISTORY — PX: BREAST BIOPSY: SHX20

## 2022-08-07 MED ORDER — GADOPICLENOL 0.5 MMOL/ML IV SOLN
7.0000 mL | Freq: Once | INTRAVENOUS | Status: AC | PRN
  Administered 2022-08-07: 7 mL via INTRAVENOUS

## 2022-09-24 ENCOUNTER — Telehealth: Payer: Self-pay | Admitting: Internal Medicine

## 2022-09-24 NOTE — Telephone Encounter (Signed)
Good afternoon Dr. Rhea Belton  The following patient is asking to have a transfer to a female provider due to having a more personal reputation with you. Are you willing to allow the transfer to Dr. Leonides Schanz or Lavon Paganini?

## 2022-09-25 ENCOUNTER — Encounter: Payer: Self-pay | Admitting: Internal Medicine

## 2022-09-25 NOTE — Telephone Encounter (Signed)
Good morning Dr. Leonides Schanz, Dr. Lavon Paganini  Would either of you be willing to take on this patient to do her colonoscopy? She is requesting a female provider

## 2022-09-25 NOTE — Telephone Encounter (Signed)
Yes, that is fine with me She has become a social friend with our family since establishing with me years ago.  Very reasonable request. For Dr. Leonides Schanz or Dr. Lavon Paganini -- I see no concerns with this transfer request JMP

## 2022-12-10 ENCOUNTER — Ambulatory Visit: Payer: BC Managed Care – PPO | Admitting: Physician Assistant

## 2022-12-26 ENCOUNTER — Ambulatory Visit: Payer: BC Managed Care – PPO | Admitting: Internal Medicine

## 2022-12-26 ENCOUNTER — Encounter: Payer: Self-pay | Admitting: Internal Medicine

## 2022-12-26 VITALS — BP 104/72 | HR 70 | Ht 67.5 in | Wt 148.0 lb

## 2022-12-26 DIAGNOSIS — Z1211 Encounter for screening for malignant neoplasm of colon: Secondary | ICD-10-CM

## 2022-12-26 DIAGNOSIS — R1012 Left upper quadrant pain: Secondary | ICD-10-CM

## 2022-12-26 DIAGNOSIS — R1011 Right upper quadrant pain: Secondary | ICD-10-CM | POA: Diagnosis not present

## 2022-12-26 DIAGNOSIS — R112 Nausea with vomiting, unspecified: Secondary | ICD-10-CM | POA: Diagnosis not present

## 2022-12-26 DIAGNOSIS — K5900 Constipation, unspecified: Secondary | ICD-10-CM | POA: Diagnosis not present

## 2022-12-26 DIAGNOSIS — K219 Gastro-esophageal reflux disease without esophagitis: Secondary | ICD-10-CM | POA: Diagnosis not present

## 2022-12-26 MED ORDER — NA SULFATE-K SULFATE-MG SULF 17.5-3.13-1.6 GM/177ML PO SOLN: ORAL | 0 refills | Status: DC

## 2022-12-26 NOTE — Progress Notes (Signed)
Chief Complaint: N&V  HPI : 47 year old female with history of asthma, chronic constipation, hemorrhoids presents with N&V  Patient previously followed with Dr. Rhea Belton but has not seen him since 2017. She has had 7 episodes of vomiting since 07/2022. Denies hematemesis or coffee ground emesis. She had to go to the ED once in 07/2022 when she was given antiemetics and IVFs, and her symptoms were attributed to an episode of gastroenteritis at that time. She has always had a sensitive stomach, but she is not sure what is causing the symptoms. She has cut out all spicy foods. She went to [place] in 09/2022 and did not take her thyroid medication. She felt well in [place] without any GI issues. Has continued to have nausea but has not had any additional vomiting. She does take Zofran and antacids PRN. Recently she had an episode of more severe constipation for which she takes Colace. She has tried Linzess in the past, which caused significant diarrhea. Miralax did not work very well and it was hard to her to take. She has one BM once every 2 days on average. Endorses some rectal bleeding that is bright red, which is more than she has had in the past. She had an anal fissure in the past. Denies prior colonoscopy or EGD. She has had thrombosed external hemorrhoids in the past that were treated with surgery 3 times (the first two times were done by her gynecologist and were not effective and the last time was done by a colorectal surgeon). She does feel like she she has more heartburn. She does have migraines at times. She doesn't think that the N&V is associated with her menstrual periods. Denies dysphagia. Endorses occasional chest burning and regurgitation. Endorses 1 alcoholic beverage per week on average. She has learned over time that she cannot drink a lot of alcohol. In 2022 she did go to the ED for ab pain after drinking alcohol and was found to have an elevated lipase but no overt signs of pancreatitis on  imaging. Denies marijuana use. Endorses bloating. Mother and brother had colon polyps. Mother has had diverticulosis. She takes Motrin once a week. Maybe has some burning in her upper abdomen   Past Medical History:  Diagnosis Date   AMA (advanced maternal age) multigravida 35+    Anemia    Asthma    Chronic constipation    H. pylori infection    Hemorrhoid    Hx of varicella    Irritable bowel syndrome    Pneumonia    Mid teens to mid 20's     Past Surgical History:  Procedure Laterality Date   BREAST BIOPSY Right    benign   BREATH TEK H PYLORI N/A 05/05/2014   Procedure: BREATH Nelda Bucks;  Surgeon: Beverley Fiedler, MD;  Location: [facility];  Service: Gastroenterology;  Laterality: N/A;   INTRAUTERINE DEVICE (IUD) INSERTION     WISDOM TOOTH EXTRACTION     Family History  Problem Relation Age of Onset   Hypertension Mother    Thyroid disease Mother    Breast cancer Mother    Urolithiasis Father    Urolithiasis Brother    Breast cancer Maternal Grandmother    Breast cancer Paternal Grandmother    Heart Problems Paternal Grandfather    Breast cancer Paternal Aunt    Thyroid cancer Cousin        mat 2nd cousin   Colon cancer Neg Hx    Esophageal cancer  Neg Hx    Social History   Tobacco Use   Smoking status: Former    Current packs/day: 0.00    Types: Cigarettes    Quit date: 02/19/2000    Years since quitting: 22.8   Smokeless tobacco: Never   Tobacco comments:    Just smoked in college  Vaping Use   Vaping status: Never Used  Substance Use Topics   Alcohol use: Yes    Comment: Occassionally   Drug use: No   Current Outpatient Medications  Medication Sig Dispense Refill   albuterol (PROVENTIL HFA;VENTOLIN HFA) 108 (90 BASE) MCG/ACT inhaler Inhale 2 puffs into the lungs every 6 (six) hours as needed for wheezing.     calcium carbonate (TUMS - DOSED IN MG ELEMENTAL CALCIUM) 500 MG chewable tablet Chew 1 tablet by mouth as needed for indigestion or  heartburn.     docusate sodium (COLACE) 100 MG capsule Take 100 mg by mouth 2 (two) times daily.     FLUoxetine (PROZAC) 10 MG capsule Take 10 mg by mouth daily.     levocetirizine (XYZAL) 5 MG tablet Take 5 mg by mouth every evening.     levonorgestrel (KYLEENA) 19.5 MG IUD 1 each by Intrauterine route once.     levothyroxine (SYNTHROID) 50 MCG tablet Take 50 mcg by mouth daily before breakfast. BRAND NAME ONLY     Multiple Vitamins-Minerals (MULTIVITAL PO) Take 1 tablet by mouth daily.     ondansetron (ZOFRAN) 4 MG tablet Take 1 tablet (4 mg total) by mouth every 8 (eight) hours as needed for nausea or vomiting. 12 tablet 0   ondansetron (ZOFRAN-ODT) 8 MG disintegrating tablet 8mg  ODT q4 hours prn nausea 10 tablet 0   No current facility-administered medications for this visit.   No Known Allergies   Review of Systems: All systems reviewed and negative except where noted in HPI.   Physical Exam: BP 104/72   Pulse 70   Ht 5' 7.5" (1.715 m)   Wt 148 lb (67.1 kg)   BMI 22.84 kg/m  Constitutional: Pleasant,well-developed, female in no acute distress. HEENT: Normocephalic and atraumatic. Conjunctivae are normal. No scleral icterus. Cardiovascular: Normal rate, regular rhythm.  Pulmonary/chest: Effort normal and breath sounds normal. No wheezing, rales or rhonchi. Abdominal: Soft, nondistended, mildly tender diffusely. Bowel sounds active throughout. There are no masses palpable. No hepatomegaly. Extremities: No edema Neurological: Alert and oriented to person place and time. Skin: Skin is warm and dry. No rashes noted. Psychiatric: Normal mood and affect. Behavior is normal.  Labs 10/2013: CRP normal.  TSH normal.  CBC normal.  Labs 01/2014: TTG IgA negative. IgA nml.  H. pylori IgG positive.  AC normal.  Labs 07/2020: Lipase elevated at 103.  CBC with elevated WBC of 10.6.  CMP unremarkable.  Labs 07/2022: Lipase nml.  CBC with elevated WBC of 11.2.  CMP with mildly elevated  glucose of 148.  Beta-hCG was negative.  CT A/P w/contrast 07/30/22: IMPRESSION: 1. 5.4 cm right ovarian lesion. Pelvic ultrasound suggested for further evaluation. 2. Nonobstructing intrarenal stones bilaterally. 3. Intrauterine device is present.  U/S abdomen 11/12/22: IMPRESSION:  No acute abnormality or etiology for the patient's symptoms.  Probable nonobstructive punctate bilateral renal calculi.   ASSESSMENT AND PLAN: N&V Upper abdominal discomfort History of H. pylori infection GERD Constipation Hemorrhoids, history of anal fissure Colon cancer screening Incidental rectal bleeding Patient presents with nausea and vomiting that have been present since 07/2022.  Unclear etiology at this time, though could  consider recurrence of her H. pylori, uncontrolled GERD, PUD, gastritis/duodenitis, constipation, and/or migraines as potential contributors.  Will have her try to increase her water to intake and start a daily fiber supplement.  She recently had her labs checked and her thyroid medication was recently readjusted.  She also recently had a CT scan and ultrasound performed, which only showed nonobstructing renal stones and a right ovarian lesion that was followed up by OB/GYN.  Will plan for EGD further evaluation of her nausea, vomiting, and upper abdominal abdominal discomfort.  Patient is due for colon cancer screening so we will plan for colonoscopy to get her up-to-date.  - GERD handout - Aim to drink 8 cups of water per day - Start daily fiber supplement - EGD/Colonoscopy [facility] with 2-day prep  Eulah Pont, MD  I spent 60 minutes of time, including in depth chart review, independent review of results as outlined above, communicating results with the patient directly, face-to-face time with the patient, coordinating care, ordering studies and medications as appropriate, and documentation.

## 2022-12-26 NOTE — Patient Instructions (Addendum)
You have been scheduled for an endoscopy and colonoscopy. Please follow the written instructions given to you at your visit today.  Please pick up your prep supplies at the pharmacy within the next 1-3 days.  If you use inhalers (even only as needed), please bring them with you on the day of your procedure.  DO NOT TAKE 7 DAYS PRIOR TO TEST- Trulicity (dulaglutide) Ozempic, Wegovy (semaglutide) Mounjaro (tirzepatide) Bydureon Bcise (exanatide extended release)  DO NOT TAKE 1 DAY PRIOR TO YOUR TEST Rybelsus (semaglutide) Adlyxin (lixisenatide) Victoza (liraglutide) Byetta (exanatide) ___________________________________________________________________________  We have sent the following medications to your pharmacy for you to pick up at your convenience: Suprep  Drink 8 cups of water a day and walk 30 minutes a day.  Please purchase the following medications over the counter and take as directed: Fiber supplement such as Benefiber- use as directed daily  If your blood pressure at your visit was 140/90 or greater, please contact your primary care physician to follow up on this.  _______________________________________________________  If you are age 58 or older, your body mass index should be between 23-30. Your Body mass index is 22.84 kg/m. If this is out of the aforementioned range listed, please consider follow up with your Primary Care Provider.  If you are age 63 or younger, your body mass index should be between 19-25. Your Body mass index is 22.84 kg/m. If this is out of the aformentioned range listed, please consider follow up with your Primary Care Provider.   ________________________________________________________  The [facility] providers would like to encourage you to use [facility] to communicate with providers for non-urgent requests or questions.  Due to long hold times on the telephone, sending your provider a message by [facility] may be a faster and more efficient way  to get a response.  Please allow 48 business hours for a response.  Please remember that this is for non-urgent requests.  _______________________________________________________   Due to recent changes in healthcare laws, you may see the results of your imaging and laboratory studies on MyChart before your provider has had a chance to review them.  We understand that in some cases there may be results that are confusing or concerning to you. Not all laboratory results come back in the same time frame and the provider may be waiting for multiple results in order to interpret others.  Please give [place] 48 hours in order for your provider to thoroughly review all the results before contacting the office for clarification of your results.   Thank you for entrusting me with your care and for choosing [facility], Dr. Eulah Pont

## 2023-01-03 ENCOUNTER — Other Ambulatory Visit: Payer: Self-pay | Admitting: Obstetrics and Gynecology

## 2023-01-03 DIAGNOSIS — R9389 Abnormal findings on diagnostic imaging of other specified body structures: Secondary | ICD-10-CM

## 2023-01-06 ENCOUNTER — Ambulatory Visit: Payer: BC Managed Care – PPO | Admitting: Neurology

## 2023-01-31 ENCOUNTER — Encounter: Payer: Self-pay | Admitting: Internal Medicine

## 2023-01-31 ENCOUNTER — Ambulatory Visit (AMBULATORY_SURGERY_CENTER): Payer: BC Managed Care – PPO | Admitting: Internal Medicine

## 2023-01-31 VITALS — BP 101/59 | HR 95 | Temp 98.1°F | Resp 20 | Ht 67.0 in | Wt 148.0 lb

## 2023-01-31 DIAGNOSIS — K319 Disease of stomach and duodenum, unspecified: Secondary | ICD-10-CM | POA: Diagnosis not present

## 2023-01-31 DIAGNOSIS — D124 Benign neoplasm of descending colon: Secondary | ICD-10-CM | POA: Diagnosis not present

## 2023-01-31 DIAGNOSIS — Z1211 Encounter for screening for malignant neoplasm of colon: Secondary | ICD-10-CM

## 2023-01-31 DIAGNOSIS — K2100 Gastro-esophageal reflux disease with esophagitis, without bleeding: Secondary | ICD-10-CM | POA: Diagnosis not present

## 2023-01-31 DIAGNOSIS — K31A19 Gastric intestinal metaplasia without dysplasia, unspecified site: Secondary | ICD-10-CM | POA: Diagnosis not present

## 2023-01-31 DIAGNOSIS — R1013 Epigastric pain: Secondary | ICD-10-CM

## 2023-01-31 DIAGNOSIS — R112 Nausea with vomiting, unspecified: Secondary | ICD-10-CM

## 2023-01-31 DIAGNOSIS — K648 Other hemorrhoids: Secondary | ICD-10-CM | POA: Diagnosis not present

## 2023-01-31 DIAGNOSIS — K219 Gastro-esophageal reflux disease without esophagitis: Secondary | ICD-10-CM

## 2023-01-31 DIAGNOSIS — Z8619 Personal history of other infectious and parasitic diseases: Secondary | ICD-10-CM

## 2023-01-31 DIAGNOSIS — K31A11 Gastric intestinal metaplasia without dysplasia, involving the antrum: Secondary | ICD-10-CM | POA: Diagnosis not present

## 2023-01-31 MED ORDER — PANTOPRAZOLE SODIUM 40 MG PO TBEC: 40.0000 mg | DELAYED_RELEASE_TABLET | Freq: Every day | ORAL | 0 refills | Status: DC

## 2023-01-31 MED ORDER — SODIUM CHLORIDE 0.9 % IV SOLN: 500.0000 mL | INTRAVENOUS | Status: DC

## 2023-01-31 NOTE — Progress Notes (Signed)
Pt's states no medical or surgical changes since previsit or office visit. 

## 2023-01-31 NOTE — Op Note (Signed)
[facility] Patient Name: Alyssa Martinez Procedure Date: 01/31/2023 2:52 PM MRN: [id number] Endoscopist: Madelyn Brunner [place] , , [id number] Age: 47 Referring MD:  Date of Birth: [birth year] Gender: Female Account #: [id number] Procedure:                Colonoscopy Indications:              Screening for colorectal malignant neoplasm Medicines:                Monitored Anesthesia Care Procedure:                Pre-Anesthesia Assessment:                           - Prior to the procedure, a History and Physical                            was performed, and patient medications and                            allergies were reviewed. The patient's tolerance of                            previous anesthesia was also reviewed. The risks                            and benefits of the procedure and the sedation                            options and risks were discussed with the patient.                            All questions were answered, and informed consent                            was obtained. Prior Anticoagulants: The patient has                            taken no anticoagulant or antiplatelet agents. ASA                            Grade Assessment: II - A patient with mild systemic                            disease. After reviewing the risks and benefits,                            the patient was deemed in satisfactory condition to                            undergo the procedure.                           After obtaining informed consent, the colonoscope  was passed under direct vision. Throughout the                            procedure, the patient's blood pressure, pulse, and                            oxygen saturations were monitored continuously. The                            CF [id number] #[id number] was introduced through the anus                            and advanced to the the terminal ileum. The                             colonoscopy was performed without difficulty. The                            patient tolerated the procedure well. The quality                            of the bowel preparation was excellent. The                            terminal ileum, ileocecal valve, appendiceal                            orifice, and rectum were photographed. Scope In: 3:14:05 PM Scope Out: 3:25:28 PM Scope Withdrawal Time: 0 hours 8 minutes 5 seconds  Total Procedure Duration: 0 hours 11 minutes 23 seconds  Findings:                 The terminal ileum appeared normal.                           A 3 mm polyp was found in the descending colon. The                            polyp was sessile. The polyp was removed with a                            cold snare. Resection and retrieval were complete.                           Non-bleeding internal hemorrhoids were found during                            retroflexion. Complications:            No immediate complications. Estimated Blood Loss:     Estimated blood loss was minimal. Impression:               - The examined portion of the ileum was normal.                           -  One 3 mm polyp in the descending colon, removed                            with a cold snare. Resected and retrieved.                           - Non-bleeding internal hemorrhoids. Recommendation:           - Discharge patient to home (with escort).                           - Await pathology results.                           - The findings and recommendations were discussed                            with the patient. Dr Particia Lather "Alan Ripper" Leonides Schanz,  01/31/2023 3:38:42 PM

## 2023-01-31 NOTE — Patient Instructions (Signed)
Handouts provided about gastritis, reflux, hemorrhoids and polyps.  Use Protonix (pantoprazole) 40 mg PO daily for 8 weeks.  Await pathology results.  Return to GI clinic in 2-3 months.   YOU HAD AN ENDOSCOPIC PROCEDURE TODAY AT THE [facility]:   Refer to the procedure report that was given to you for any specific questions about what was found during the examination.  If the procedure report does not answer your questions, please call your gastroenterologist to clarify.  If you requested that your care partner not be given the details of your procedure findings, then the procedure report has been included in a sealed envelope for you to review at your convenience later.  YOU SHOULD EXPECT: Some feelings of bloating in the abdomen. Passage of more gas than usual.  Walking can help get rid of the air that was put into your GI tract during the procedure and reduce the bloating. If you had a lower endoscopy (such as a colonoscopy or flexible sigmoidoscopy) you may notice spotting of blood in your stool or on the toilet paper. If you underwent a bowel prep for your procedure, you may not have a normal bowel movement for a few days.  Please Note:  You might notice some irritation and congestion in your nose or some drainage.  This is from the oxygen used during your procedure.  There is no need for concern and it should clear up in a day or so.  SYMPTOMS TO REPORT IMMEDIATELY:  Following lower endoscopy (colonoscopy or flexible sigmoidoscopy):  Excessive amounts of blood in the stool  Significant tenderness or worsening of abdominal pains  Swelling of the abdomen that is new, acute  Fever of 100F or higher  Following upper endoscopy (EGD)  Vomiting of blood or coffee ground material  New chest pain or pain under the shoulder blades  Painful or persistently difficult swallowing  New shortness of breath  Fever of 100F or higher  Black, tarry-looking stools  For urgent or  emergent issues, a gastroenterologist can be reached at any hour by calling [phone]. Do not use MyChart messaging for urgent concerns.    DIET:  We do recommend a small meal at first, but then you may proceed to your regular diet.  Drink plenty of fluids but you should avoid alcoholic beverages for 24 hours.  ACTIVITY:  You should plan to take it easy for the rest of today and you should NOT DRIVE or use heavy machinery until tomorrow (because of the sedation medicines used during the test).    FOLLOW UP: Our staff will call the number listed on your records the next business day following your procedure.  We will call around 7:15- 8:00 am to check on you and address any questions or concerns that you may have regarding the information given to you following your procedure. If we do not reach you, we will leave a message.     If any biopsies were taken you will be contacted by phone or by letter within the next 1-3 weeks.  Please call us at [phone] if you have not heard about the biopsies in 3 weeks.    SIGNATURES/CONFIDENTIALITY: You and/or your care partner have signed paperwork which will be entered into your electronic medical record.  These signatures attest to the fact that that the information above on your After Visit Summary has been reviewed and is understood.  Full responsibility of the confidentiality of this discharge information lies with you and/or  your care-partner.

## 2023-01-31 NOTE — Progress Notes (Signed)
Sedate, gd SR, tolerated procedure well, VSS, report to RN 

## 2023-01-31 NOTE — Progress Notes (Signed)
Called to room to assist during endoscopic procedure.  Patient ID and intended procedure confirmed with present staff. Received instructions for my participation in the procedure from the performing physician.  

## 2023-01-31 NOTE — Op Note (Addendum)
[facility] Patient Name: Alyssa Martinez Procedure Date: 01/31/2023 2:52 PM MRN: [id number] Endoscopist: Particia Lather , , [id number] Age: 47 Referring MD:  Date of Birth: January 01, 1976 Gender: Female Account #: [id number] Procedure:                Upper GI endoscopy Indications:              Epigastric abdominal pain, Heartburn, Nausea with                            vomiting Medicines:                Monitored Anesthesia Care Procedure:                Pre-Anesthesia Assessment:                           - Prior to the procedure, a History and Physical                            was performed, and patient medications and                            allergies were reviewed. The patient's tolerance of                            previous anesthesia was also reviewed. The risks                            and benefits of the procedure and the sedation                            options and risks were discussed with the patient.                            All questions were answered, and informed consent                            was obtained. Prior Anticoagulants: The patient has                            taken no anticoagulant or antiplatelet agents. ASA                            Grade Assessment: II - A patient with mild systemic                            disease. After reviewing the risks and benefits,                            the patient was deemed in satisfactory condition to                            undergo the procedure.  After obtaining informed consent, the endoscope was                            passed under direct vision. Throughout the                            procedure, the patient's blood pressure, pulse, and                            oxygen saturations were monitored continuously. The                            GIF [id number] #[id number] was introduced through the                            mouth, and advanced to the second part  of duodenum.                            The upper GI endoscopy was accomplished without                            difficulty. The patient tolerated the procedure                            well. Scope In: Scope Out: Findings:                 White nummular lesions were noted in the entire                            esophagus. Biopsies were taken with a cold forceps                            for histology.                           LA Grade A (one or more mucosal breaks less than 5                            mm, not extending between tops of 2 mucosal folds)                            esophagitis with no bleeding was found at the                            gastroesophageal junction.                           Localized inflammation characterized by congestion                            (edema), erosions and erythema was found in the                            gastric antrum. Biopsies were  taken with a cold                            forceps for histology.                           The examined duodenum was normal. Complications:            No immediate complications. Estimated Blood Loss:     Estimated blood loss was minimal. Impression:               - White nummular lesions in esophageal mucosa.                            Biopsied.                           - LA Grade A reflux esophagitis with no bleeding.                           - Gastritis. Biopsied.                           - Normal examined duodenum. Recommendation:           - Discharge patient to home (with escort).                           - Await pathology results.                           - Use Protonix (pantoprazole) 40 mg PO daily for 8                            weeks.                           - Return to GI clinic in 2-3 months.                           - Perform a colonoscopy today. Dr Particia Lather "Alan Ripper" Leonides Schanz,  01/31/2023 3:37:16 PM

## 2023-01-31 NOTE — Progress Notes (Signed)
GASTROENTEROLOGY PROCEDURE H&P NOTE   Primary Care Physician: Eartha Inch, MD    Reason for Procedure:   N&V, upper abdominal discomfort, history of H pylori infection, GERD, colon cancer screening  Plan:    EGD/colonoscopy  Patient is appropriate for endoscopic procedure(s) in the ambulatory ([facility]) setting.  The nature of the procedure, as well as the risks, benefits, and alternatives were carefully and thoroughly reviewed with the patient. Ample time for discussion and questions allowed. The patient understood, was satisfied, and agreed to proceed.     HPI: Alyssa Martinez is a 47 y.o. female who presents for EGD/colonoscopy for evaluation of N&V, upper abdominal discomfort, history of H pylori infection, GERD, and colon cancer screening.  Patient was most recently seen in the Gastroenterology Clinic on 12/26/22.  No interval change in medical history since that appointment. Please refer to that note for full details regarding GI history and clinical presentation.   Past Medical History:  Diagnosis Date   AMA (advanced maternal age) multigravida 35+    Anemia    Asthma    Chronic constipation    H. pylori infection    Hemorrhoid    Hx of varicella    Irritable bowel syndrome    Pneumonia    Mid teens to mid 20's    Past Surgical History:  Procedure Laterality Date   BREAST BIOPSY Right    benign   BREATH TEK H PYLORI N/A 05/05/2014   Procedure: BREATH Nelda Bucks;  Surgeon: Beverley Fiedler, MD;  Location: [facility];  Service: Gastroenterology;  Laterality: N/A;   INTRAUTERINE DEVICE (IUD) INSERTION     WISDOM TOOTH EXTRACTION      Prior to Admission medications   Medication Sig Start Date End Date Taking? Authorizing Provider  docusate sodium (COLACE) 100 MG capsule Take 100 mg by mouth 2 (two) times daily.   Yes [provider]  EPINEPHrine 0.3 mg/0.3 mL IJ SOAJ injection INJECT CONTENTS OF 1 PEN IN THE MUSCLE ONE TIME AS NEEDED AS DIRECTED  03/13/22  Yes [provider]  FLUoxetine (PROZAC) 10 MG capsule Take 10 mg by mouth daily.   Yes [provider]  Multiple Vitamins-Minerals (MULTIVITAL PO) Take 1 tablet by mouth daily.   Yes [provider]  albuterol (PROVENTIL HFA;VENTOLIN HFA) 108 (90 BASE) MCG/ACT inhaler Inhale 2 puffs into the lungs every 6 (six) hours as needed for wheezing.    [provider]  calcium carbonate (TUMS - DOSED IN MG ELEMENTAL CALCIUM) 500 MG chewable tablet Chew 1 tablet by mouth as needed for indigestion or heartburn.    [provider]  levocetirizine (XYZAL) 5 MG tablet Take 5 mg by mouth every evening.    [provider]  levonorgestrel (KYLEENA) 19.5 MG IUD 1 each by Intrauterine route once.    [provider]  levothyroxine (SYNTHROID) 50 MCG tablet Take 50 mcg by mouth daily before breakfast. BRAND NAME ONLY Patient not taking: Reported on 01/31/2023    [provider]  ondansetron (ZOFRAN) 4 MG tablet Take 1 tablet (4 mg total) by mouth every 8 (eight) hours as needed for nausea or vomiting. 01/31/20   Haskel Schroeder, PA-C  ondansetron (ZOFRAN-ODT) 8 MG disintegrating tablet 8mg  ODT q4 hours prn nausea 07/31/22   Geoffery Lyons, MD  linaclotide [employer]) 72 MCG capsule Take 1 capsule (72 mcg total) by mouth daily before breakfast. 07/18/15 09/19/19  Pyrtle, Carie Caddy, MD  loratadine (CLARITIN) 10 MG tablet  Take 10 mg by mouth daily as needed for allergies.   09/19/19  [provider]  ranitidine (ZANTAC) 150 MG tablet Take 150 mg by mouth as needed for heartburn.  09/19/19  [provider]    Current Outpatient Medications  Medication Sig Dispense Refill   docusate sodium (COLACE) 100 MG capsule Take 100 mg by mouth 2 (two) times daily.     EPINEPHrine 0.3 mg/0.3 mL IJ SOAJ injection INJECT CONTENTS OF 1 PEN IN THE MUSCLE ONE TIME AS NEEDED AS DIRECTED     FLUoxetine (PROZAC) 10 MG capsule Take 10 mg by mouth daily.      Multiple Vitamins-Minerals (MULTIVITAL PO) Take 1 tablet by mouth daily.     albuterol (PROVENTIL HFA;VENTOLIN HFA) 108 (90 BASE) MCG/ACT inhaler Inhale 2 puffs into the lungs every 6 (six) hours as needed for wheezing.     calcium carbonate (TUMS - DOSED IN MG ELEMENTAL CALCIUM) 500 MG chewable tablet Chew 1 tablet by mouth as needed for indigestion or heartburn.     levocetirizine (XYZAL) 5 MG tablet Take 5 mg by mouth every evening.     levonorgestrel (KYLEENA) 19.5 MG IUD 1 each by Intrauterine route once.     levothyroxine (SYNTHROID) 50 MCG tablet Take 50 mcg by mouth daily before breakfast. BRAND NAME ONLY (Patient not taking: Reported on 01/31/2023)     ondansetron (ZOFRAN) 4 MG tablet Take 1 tablet (4 mg total) by mouth every 8 (eight) hours as needed for nausea or vomiting. 12 tablet 0   ondansetron (ZOFRAN-ODT) 8 MG disintegrating tablet 8mg  ODT q4 hours prn nausea 10 tablet 0   Current Facility-Administered Medications  Medication Dose Route Frequency Provider Last Rate Last Admin   0.9 %  sodium chloride infusion  500 mL Intravenous Continuous Imogene Burn, MD        Allergies as of 01/31/2023   (No Known Allergies)    Family History  Problem Relation Age of Onset   Hypertension Mother    Thyroid disease Mother    Breast cancer Mother    Urolithiasis Father    Urolithiasis Brother    Breast cancer Maternal Grandmother    Breast cancer Paternal Grandmother    Heart Problems Paternal Grandfather    Breast cancer Paternal Aunt    Thyroid cancer Cousin        mat 2nd cousin   Colon cancer Neg Hx    Esophageal cancer Neg Hx     Social History   Socioeconomic History   Marital status: Married    Spouse name: Casimiro Needle   Number of children: 2   Years of education: Not on file   Highest education level: Not on file  Occupational History   Occupation: Futures trader   Occupation: preschool admin  Tobacco Use   Smoking status: Former    Current packs/day: 0.00     Types: Cigarettes    Quit date: 02/19/2000    Years since quitting: 22.9   Smokeless tobacco: Never   Tobacco comments:    Just smoked in college  Vaping Use   Vaping status: Never Used  Substance and Sexual Activity   Alcohol use: Yes    Comment: Occassionally   Drug use: No   Sexual activity: Yes    Birth control/protection: None  Other Topics Concern   Not on file  Social History Narrative   Not on file   Social Drivers of Health   Financial Resource Strain: Low Risk  (11/06/2022)  Received from [facility]   Overall Financial Resource Strain (CARDIA)    Difficulty of Paying Living Expenses: Not hard at all  Food Insecurity: No Food Insecurity (11/06/2022)   Received from [facility]   Hunger Vital Sign    Worried About Running Out of Food in the Last Year: Never true    Ran Out of Food in the Last Year: Never true  Transportation Needs: No Transportation Needs (11/06/2022)   Received from [facility] - Transportation    Lack of Transportation (Medical): No    Lack of Transportation (Non-Medical): No  Physical Activity: Insufficiently Active (11/06/2022)   [facility]   Exercise Vital Sign    Days of Exercise per Week: 2 days    Minutes of Exercise per Session: 40 min  Stress: Stress Concern Present (11/06/2022)   Received from [facility] - Occupational Stress Questionnaire    Feeling of Stress : Rather much  Social Connections: Moderately Integrated (11/06/2022)   Received from [facility]   Social Network    How would you rate your social network (family, work, friends)?: Adequate participation with social networks  Intimate Partner Violence: Not At Risk (11/06/2022)   Received from [facility]   HITS    Over the last 12 months how often did your partner physically hurt you?: Never    Over the last 12 months how often did your partner insult you or talk down to you?: Never    Over the  last 12 months how often did your partner threaten you with physical harm?: Never    Over the last 12 months how often did your partner scream or curse at you?: Never    Physical Exam: Vital signs in last 24 hours: BP 122/74   Pulse 78   Temp 98.1 F (36.7 C)   Ht 5\' 7"  (1.702 m)   Wt 148 lb (67.1 kg)   SpO2 100%   BMI 23.18 kg/m  GEN: NAD EYE: Sclerae anicteric ENT: MMM CV: Non-tachycardic Pulm: No increased WOB GI: Soft NEURO:  Alert & Oriented   Eulah Pont, MD [facility]   01/31/2023 2:48 PM

## 2023-02-03 ENCOUNTER — Telehealth: Payer: Self-pay

## 2023-02-03 NOTE — Telephone Encounter (Signed)
Attempted f/u call. No answer, left VM. 

## 2023-02-05 LAB — SURGICAL PATHOLOGY

## 2023-02-06 ENCOUNTER — Encounter: Payer: Self-pay | Admitting: Internal Medicine

## 2023-02-06 ENCOUNTER — Telehealth: Payer: Self-pay

## 2023-02-06 NOTE — Telephone Encounter (Signed)
Pharmacy Patient Advocate Encounter   Received notification from CoverMyMeds that prior authorization for Pantoprazole 40 mg tablets is required/requested.   Insurance verification completed.   The patient is insured through [facility] .   Per test claim: PA required; PA submitted to above mentioned insurance via CoverMyMeds Key/confirmation #/EOC [id number] Status is pending

## 2023-02-07 ENCOUNTER — Ambulatory Visit
Admission: RE | Admit: 2023-02-07 | Discharge: 2023-02-07 | Disposition: A | Discharge status: Home / Self Care | Payer: BC Managed Care – PPO | Source: Ambulatory Visit | Attending: Obstetrics and Gynecology | Admitting: Obstetrics and Gynecology

## 2023-02-07 DIAGNOSIS — R9389 Abnormal findings on diagnostic imaging of other specified body structures: Secondary | ICD-10-CM

## 2023-02-07 NOTE — Telephone Encounter (Signed)
Pharmacy Patient Advocate Encounter  Received notification from [facility] that Prior Authorization for PANTOPRAZOLE 40MG  has been APPROVED from 12.19.24 to 12.19.25   PA #/Case ID/Reference #: [id number]

## 2023-02-10 ENCOUNTER — Encounter: Payer: BC Managed Care – PPO | Admitting: Internal Medicine

## 2023-04-19 ENCOUNTER — Other Ambulatory Visit: Payer: Self-pay | Admitting: Internal Medicine

## 2023-04-19 DIAGNOSIS — K219 Gastro-esophageal reflux disease without esophagitis: Secondary | ICD-10-CM

## 2023-08-20 ENCOUNTER — Other Ambulatory Visit: Payer: Self-pay | Admitting: Obstetrics and Gynecology

## 2023-08-20 DIAGNOSIS — Z803 Family history of malignant neoplasm of breast: Secondary | ICD-10-CM

## 2023-09-21 ENCOUNTER — Ambulatory Visit
Admission: RE | Admit: 2023-09-21 | Discharge: 2023-09-21 | Disposition: A | Discharge status: Home / Self Care | Source: Ambulatory Visit | Attending: Obstetrics and Gynecology | Admitting: Obstetrics and Gynecology

## 2023-09-21 DIAGNOSIS — Z803 Family history of malignant neoplasm of breast: Secondary | ICD-10-CM

## 2023-09-21 MED ORDER — GADOPICLENOL 0.5 MMOL/ML IV SOLN
7.0000 mL | Freq: Once | INTRAVENOUS | Status: AC | PRN
  Administered 2023-09-21: 7 mL via INTRAVENOUS
